# Patient Record
Sex: Female | Born: 1950 | Race: White | Hispanic: No | Marital: Married | Smoking: Never smoker
Health system: Southern US, Community
[De-identification: ages and names within clinical notes are randomized; demographics above are authoritative.]

## PROBLEM LIST (undated history)

## (undated) DIAGNOSIS — R739 Hyperglycemia, unspecified: Secondary | ICD-10-CM

## (undated) DIAGNOSIS — E0500 Thyrotoxicosis with diffuse goiter without thyrotoxic crisis or storm: Secondary | ICD-10-CM

## (undated) DIAGNOSIS — K219 Gastro-esophageal reflux disease without esophagitis: Secondary | ICD-10-CM

## (undated) DIAGNOSIS — E785 Hyperlipidemia, unspecified: Secondary | ICD-10-CM

## (undated) DIAGNOSIS — M1990 Unspecified osteoarthritis, unspecified site: Secondary | ICD-10-CM

## (undated) DIAGNOSIS — E669 Obesity, unspecified: Secondary | ICD-10-CM

## (undated) DIAGNOSIS — K5792 Diverticulitis of intestine, part unspecified, without perforation or abscess without bleeding: Secondary | ICD-10-CM

## (undated) DIAGNOSIS — K5900 Constipation, unspecified: Secondary | ICD-10-CM

## (undated) DIAGNOSIS — N200 Calculus of kidney: Secondary | ICD-10-CM

## (undated) DIAGNOSIS — I10 Essential (primary) hypertension: Secondary | ICD-10-CM

## (undated) DIAGNOSIS — N390 Urinary tract infection, site not specified: Secondary | ICD-10-CM

## (undated) HISTORY — DX: Obesity, unspecified: E66.9

## (undated) HISTORY — DX: Gastro-esophageal reflux disease without esophagitis: K21.9

## (undated) HISTORY — PX: WRIST SURGERY: SHX841

## (undated) HISTORY — DX: Thyrotoxicosis with diffuse goiter without thyrotoxic crisis or storm: E05.00

## (undated) HISTORY — DX: Hyperlipidemia, unspecified: E78.5

## (undated) HISTORY — DX: Unspecified osteoarthritis, unspecified site: M19.90

## (undated) HISTORY — DX: Hyperglycemia, unspecified: R73.9

## (undated) HISTORY — DX: Calculus of kidney: N20.0

## (undated) HISTORY — DX: Constipation, unspecified: K59.00

## (undated) HISTORY — PX: KNEE ARTHROSCOPY: SHX127

## (undated) HISTORY — DX: Urinary tract infection, site not specified: N39.0

---

## 1999-02-07 ENCOUNTER — Other Ambulatory Visit: Admission: RE | Admit: 1999-02-07 | Discharge: 1999-02-07 | Payer: Self-pay | Admitting: Obstetrics and Gynecology

## 2000-02-18 ENCOUNTER — Other Ambulatory Visit: Admission: RE | Admit: 2000-02-18 | Discharge: 2000-02-18 | Payer: Self-pay | Admitting: Obstetrics and Gynecology

## 2001-02-23 ENCOUNTER — Other Ambulatory Visit: Admission: RE | Admit: 2001-02-23 | Discharge: 2001-02-23 | Payer: Self-pay | Admitting: Obstetrics and Gynecology

## 2002-03-17 ENCOUNTER — Other Ambulatory Visit: Admission: RE | Admit: 2002-03-17 | Discharge: 2002-03-17 | Payer: Self-pay | Admitting: Obstetrics and Gynecology

## 2003-07-24 ENCOUNTER — Other Ambulatory Visit: Admission: RE | Admit: 2003-07-24 | Discharge: 2003-07-24 | Payer: Self-pay | Admitting: Family Medicine

## 2005-08-27 ENCOUNTER — Encounter: Admission: RE | Admit: 2005-08-27 | Discharge: 2005-08-27 | Payer: Self-pay | Admitting: Family Medicine

## 2005-09-16 ENCOUNTER — Encounter: Admission: RE | Admit: 2005-09-16 | Discharge: 2005-09-16 | Payer: Self-pay | Admitting: Family Medicine

## 2006-10-01 ENCOUNTER — Encounter: Admission: RE | Admit: 2006-10-01 | Discharge: 2006-10-01 | Payer: Self-pay | Admitting: Internal Medicine

## 2007-05-06 DIAGNOSIS — K5792 Diverticulitis of intestine, part unspecified, without perforation or abscess without bleeding: Secondary | ICD-10-CM

## 2007-05-06 HISTORY — DX: Diverticulitis of intestine, part unspecified, without perforation or abscess without bleeding: K57.92

## 2007-10-05 ENCOUNTER — Encounter: Admission: RE | Admit: 2007-10-05 | Discharge: 2007-10-05 | Payer: Self-pay | Admitting: Unknown Physician Specialty

## 2010-05-26 ENCOUNTER — Encounter: Payer: Self-pay | Admitting: Family Medicine

## 2010-05-27 ENCOUNTER — Encounter: Payer: Self-pay | Admitting: Unknown Physician Specialty

## 2011-09-24 ENCOUNTER — Encounter: Payer: Self-pay | Admitting: Gastroenterology

## 2011-09-24 HISTORY — PX: COLONOSCOPY: SHX5424

## 2012-10-18 ENCOUNTER — Other Ambulatory Visit: Payer: Self-pay | Admitting: Gastroenterology

## 2013-04-22 ENCOUNTER — Other Ambulatory Visit: Payer: Self-pay | Admitting: Orthopedic Surgery

## 2013-04-26 ENCOUNTER — Other Ambulatory Visit (HOSPITAL_COMMUNITY): Payer: Self-pay

## 2013-04-26 ENCOUNTER — Encounter (HOSPITAL_COMMUNITY)
Admission: RE | Admit: 2013-04-26 | Discharge: 2013-04-26 | Disposition: A | Discharge status: Home / Self Care | Payer: BC Managed Care – PPO | Source: Ambulatory Visit | Attending: Orthopedic Surgery | Admitting: Orthopedic Surgery

## 2013-04-26 ENCOUNTER — Encounter (HOSPITAL_COMMUNITY): Payer: Self-pay

## 2013-04-26 ENCOUNTER — Encounter (HOSPITAL_COMMUNITY): Payer: Self-pay | Admitting: Pharmacy Technician

## 2013-04-26 DIAGNOSIS — Z01812 Encounter for preprocedural laboratory examination: Secondary | ICD-10-CM | POA: Insufficient documentation

## 2013-04-26 DIAGNOSIS — Z01818 Encounter for other preprocedural examination: Secondary | ICD-10-CM | POA: Insufficient documentation

## 2013-04-26 HISTORY — DX: Diverticulitis of intestine, part unspecified, without perforation or abscess without bleeding: K57.92

## 2013-04-26 HISTORY — DX: Essential (primary) hypertension: I10

## 2013-04-26 HISTORY — DX: Unspecified osteoarthritis, unspecified site: M19.90

## 2013-04-26 LAB — CBC WITH DIFFERENTIAL/PLATELET
Basophils Absolute: 0 10*3/uL (ref 0.0–0.1)
Eosinophils Relative: 1 % (ref 0–5)
HCT: 42.8 % (ref 36.0–46.0)
Lymphocytes Relative: 23 % (ref 12–46)
MCHC: 35.3 g/dL (ref 30.0–36.0)
MCV: 92 fL (ref 78.0–100.0)
Neutro Abs: 7.6 10*3/uL (ref 1.7–7.7)
Platelets: 264 10*3/uL (ref 150–400)
RBC: 4.65 MIL/uL (ref 3.87–5.11)
RDW: 12.9 % (ref 11.5–15.5)
WBC: 10.9 10*3/uL — ABNORMAL HIGH (ref 4.0–10.5)

## 2013-04-26 LAB — URINE MICROSCOPIC-ADD ON

## 2013-04-26 LAB — SURGICAL PCR SCREEN: MRSA, PCR: NEGATIVE

## 2013-04-26 LAB — COMPREHENSIVE METABOLIC PANEL
ALT: 11 U/L (ref 0–35)
AST: 19 U/L (ref 0–37)
CO2: 27 mEq/L (ref 19–32)
Calcium: 9.2 mg/dL (ref 8.4–10.5)
Chloride: 101 mEq/L (ref 96–112)
GFR calc non Af Amer: >90 mL/min (ref >90)
Sodium: 138 mEq/L (ref 135–145)
Total Bilirubin: 0.3 mg/dL (ref 0.3–1.2)
Total Protein: 7.3 g/dL (ref 6.0–8.3)

## 2013-04-26 LAB — URINALYSIS, ROUTINE W REFLEX MICROSCOPIC
Bilirubin Urine: NEGATIVE
Glucose, UA: NEGATIVE mg/dL
Ketones, ur: NEGATIVE mg/dL
Specific Gravity, Urine: 1.007 (ref 1.005–1.030)
pH: 7 (ref 5.0–8.0)

## 2013-04-26 LAB — APTT: aPTT: 27 seconds (ref 24–37)

## 2013-04-26 LAB — TYPE AND SCREEN

## 2013-04-26 NOTE — Pre-Procedure Instructions (Addendum)
Amy Mcbride  04/26/2013   Your procedure is scheduled on:  Monday, January 5th. Report to [facility] ([facility] Main Entrance/ Entrance "A" at 9:15AM.  Call this number if you have problems the morning of surgery: [phone]   Remember:   Do not eat food or drink liquids after midnight.   Take these medicines the morning of surgery with A SIP OF WATER: - None.               Bring hormones to the hospital with you.  Stop taking all vitamins, Asprin, naproxen, herbal medications- Monday, December 29th.  Do not wear jewelry, make-up or nail polish.  Do not wear lotions, powders, or perfumes. You may wear deodorant.  Do not shave 48 hours prior to surgery.  Do not bring valuables to the hospital.  [facility] is not responsible for any belongings or valuables.               Contacts, dentures or bridgework may not be worn into surgery.  Leave suitcase in the car. After surgery it may be brought to your room.  For patients admitted to the hospital, discharge time is determined by your treatment team.                 Special Instructions: Shower using CHG 2 nights before surgery and the night before surgery.  If you shower the day of surgery use CHG.  Use special wash - you have one bottle of CHG for all showers.  You should use approximately 1/3 of the bottle for each shower.   Please read over the following fact sheets that you were given: Pain Booklet, Coughing and Deep Breathing, Blood Transfusion Information and Surgical Site Infection Prevention

## 2013-04-27 LAB — URINE CULTURE
Colony Count: NO GROWTH
Culture: NO GROWTH

## 2013-05-08 MED ORDER — CEFAZOLIN SODIUM-DEXTROSE 2-3 GM-% IV SOLR
2.0000 g | INTRAVENOUS | Status: AC
  Administered 2013-05-09: 2 g via INTRAVENOUS
  Filled 2013-05-08 (×2): qty 50

## 2013-05-08 MED ORDER — TRANEXAMIC ACID 100 MG/ML IV SOLN
1000.0000 mg | INTRAVENOUS | Status: AC
  Administered 2013-05-09: 1000 mg via INTRAVENOUS
  Filled 2013-05-08: qty 10

## 2013-05-08 MED ORDER — BUPIVACAINE LIPOSOME 1.3 % IJ SUSP
20.0000 mL | Freq: Once | INTRAMUSCULAR | Status: DC
  Filled 2013-05-08: qty 20

## 2013-05-09 ENCOUNTER — Inpatient Hospital Stay (HOSPITAL_COMMUNITY): Payer: BC Managed Care – PPO | Admitting: Anesthesiology

## 2013-05-09 ENCOUNTER — Inpatient Hospital Stay (HOSPITAL_COMMUNITY)
Admission: RE | Admit: 2013-05-09 | Discharge: 2013-05-10 | DRG: 470 | Disposition: A | Discharge status: Home Health | Payer: BC Managed Care – PPO | Source: Ambulatory Visit | Attending: Orthopedic Surgery | Admitting: Orthopedic Surgery

## 2013-05-09 ENCOUNTER — Encounter (HOSPITAL_COMMUNITY): Payer: BC Managed Care – PPO | Admitting: Anesthesiology

## 2013-05-09 ENCOUNTER — Encounter (HOSPITAL_COMMUNITY)
Admission: RE | Disposition: A | Discharge status: Home Health | Payer: BC Managed Care – PPO | Source: Ambulatory Visit | Attending: Orthopedic Surgery

## 2013-05-09 DIAGNOSIS — Z23 Encounter for immunization: Secondary | ICD-10-CM

## 2013-05-09 DIAGNOSIS — Z96652 Presence of left artificial knee joint: Secondary | ICD-10-CM

## 2013-05-09 DIAGNOSIS — M1710 Unilateral primary osteoarthritis, unspecified knee: Principal | ICD-10-CM | POA: Diagnosis present

## 2013-05-09 DIAGNOSIS — D62 Acute posthemorrhagic anemia: Secondary | ICD-10-CM | POA: Diagnosis not present

## 2013-05-09 DIAGNOSIS — I10 Essential (primary) hypertension: Secondary | ICD-10-CM | POA: Diagnosis present

## 2013-05-09 DIAGNOSIS — Z96659 Presence of unspecified artificial knee joint: Secondary | ICD-10-CM

## 2013-05-09 HISTORY — PX: TOTAL KNEE ARTHROPLASTY: SHX125

## 2013-05-09 LAB — CBC
HCT: 36.3 % (ref 36.0–46.0)
Hemoglobin: 12.5 g/dL (ref 12.0–15.0)
MCH: 31.7 pg (ref 26.0–34.0)
MCHC: 34.4 g/dL (ref 30.0–36.0)
MCV: 92.1 fL (ref 78.0–100.0)
PLATELETS: 247 10*3/uL (ref 150–400)
RBC: 3.94 MIL/uL (ref 3.87–5.11)
RDW: 12.5 % (ref 11.5–15.5)
WBC: 15.9 10*3/uL — ABNORMAL HIGH (ref 4.0–10.5)

## 2013-05-09 LAB — CREATININE, SERUM
Creatinine, Ser: 0.66 mg/dL (ref 0.50–1.10)
GFR calc Af Amer: >90 mL/min (ref >90)
GFR calc non Af Amer: >90 mL/min (ref >90)

## 2013-05-09 SURGERY — ARTHROPLASTY, KNEE, TOTAL
Anesthesia: Regional | Site: Knee | Laterality: Left

## 2013-05-09 MED ORDER — METOCLOPRAMIDE HCL 5 MG/ML IJ SOLN
INTRAMUSCULAR | Status: DC | PRN
Start: 2013-05-09 — End: 2013-05-09
  Administered 2013-05-09: 5 mg via INTRAVENOUS

## 2013-05-09 MED ORDER — LIDOCAINE HCL (CARDIAC) 20 MG/ML IV SOLN
INTRAVENOUS | Status: DC | PRN
Start: 2013-05-09 — End: 2013-05-09
  Administered 2013-05-09: 100 mg via INTRAVENOUS

## 2013-05-09 MED ORDER — METHOCARBAMOL 100 MG/ML IJ SOLN
500.0000 mg | Freq: Four times a day (QID) | INTRAVENOUS | Status: DC | PRN
  Filled 2013-05-09: qty 5

## 2013-05-09 MED ORDER — PNEUMOCOCCAL VAC POLYVALENT 25 MCG/0.5ML IJ INJ
0.5000 mL | INJECTION | INTRAMUSCULAR | Status: DC
  Filled 2013-05-09: qty 0.5

## 2013-05-09 MED ORDER — HYDROMORPHONE HCL PF 1 MG/ML IJ SOLN
INTRAMUSCULAR | Status: AC
  Administered 2013-05-09: 0.5 mg via INTRAVENOUS
  Filled 2013-05-09: qty 2

## 2013-05-09 MED ORDER — ONDANSETRON HCL 4 MG/2ML IJ SOLN
INTRAMUSCULAR | Status: DC | PRN
  Administered 2013-05-09: 4 mg via INTRAVENOUS

## 2013-05-09 MED ORDER — HYDROCHLOROTHIAZIDE 12.5 MG PO CAPS
12.5000 mg | ORAL_CAPSULE | Freq: Every day | ORAL | Status: DC
  Administered 2013-05-10: 12.5 mg via ORAL
  Filled 2013-05-09 (×2): qty 1

## 2013-05-09 MED ORDER — FENTANYL CITRATE 0.05 MG/ML IJ SOLN
INTRAMUSCULAR | Status: DC | PRN
  Administered 2013-05-09: 25 ug via INTRAVENOUS
  Administered 2013-05-09: 50 ug via INTRAVENOUS
  Administered 2013-05-09 (×2): 25 ug via INTRAVENOUS
  Administered 2013-05-09: 125 ug via INTRAVENOUS
  Administered 2013-05-09 (×3): 25 ug via INTRAVENOUS

## 2013-05-09 MED ORDER — SODIUM CHLORIDE 0.9 % IR SOLN
Status: DC | PRN
  Administered 2013-05-09 (×2): 1000 mL

## 2013-05-09 MED ORDER — ACETAMINOPHEN 325 MG PO TABS: 650.0000 mg | ORAL_TABLET | Freq: Four times a day (QID) | ORAL | Status: DC | PRN

## 2013-05-09 MED ORDER — PHENYLEPHRINE HCL 10 MG/ML IJ SOLN
INTRAMUSCULAR | Status: DC | PRN
  Administered 2013-05-09 (×3): 80 ug via INTRAVENOUS

## 2013-05-09 MED ORDER — SENNOSIDES-DOCUSATE SODIUM 8.6-50 MG PO TABS: 1.0000 | ORAL_TABLET | Freq: Every evening | ORAL | Status: DC | PRN

## 2013-05-09 MED ORDER — OXYCODONE HCL 5 MG PO TABS
5.0000 mg | ORAL_TABLET | ORAL | Status: DC | PRN
  Administered 2013-05-09: 10 mg via ORAL
  Administered 2013-05-09: 5 mg via ORAL
  Administered 2013-05-10 (×5): 10 mg via ORAL
  Filled 2013-05-09 (×6): qty 2

## 2013-05-09 MED ORDER — MENTHOL 3 MG MT LOZG: 1.0000 | LOZENGE | OROMUCOSAL | Status: DC | PRN

## 2013-05-09 MED ORDER — HYDROMORPHONE HCL PF 1 MG/ML IJ SOLN
INTRAMUSCULAR | Status: AC
  Administered 2013-05-09: 1 mg via INTRAVENOUS
  Filled 2013-05-09: qty 1

## 2013-05-09 MED ORDER — DIPHENHYDRAMINE HCL 12.5 MG/5ML PO ELIX: 12.5000 mg | ORAL_SOLUTION | ORAL | Status: DC | PRN

## 2013-05-09 MED ORDER — SODIUM CHLORIDE 0.9 % IV SOLN: INTRAVENOUS | Status: DC

## 2013-05-09 MED ORDER — CELECOXIB 200 MG PO CAPS
200.0000 mg | ORAL_CAPSULE | Freq: Two times a day (BID) | ORAL | Status: DC
  Administered 2013-05-09 – 2013-05-10 (×2): 200 mg via ORAL
  Filled 2013-05-09 (×3): qty 1

## 2013-05-09 MED ORDER — ONDANSETRON HCL 4 MG/2ML IJ SOLN: 4.0000 mg | Freq: Once | INTRAMUSCULAR | Status: DC | PRN

## 2013-05-09 MED ORDER — ACETAMINOPHEN 650 MG RE SUPP
650.0000 mg | Freq: Four times a day (QID) | RECTAL | Status: DC | PRN
Start: 2013-05-09 — End: 2013-05-10

## 2013-05-09 MED ORDER — HYDROMORPHONE HCL PF 1 MG/ML IJ SOLN
1.0000 mg | INTRAMUSCULAR | Status: DC | PRN
  Administered 2013-05-09: 1 mg via INTRAVENOUS

## 2013-05-09 MED ORDER — WHITE PETROLATUM GEL
Status: AC
  Administered 2013-05-09: 0.2
  Filled 2013-05-09: qty 5

## 2013-05-09 MED ORDER — METOCLOPRAMIDE HCL 5 MG PO TABS
5.0000 mg | ORAL_TABLET | Freq: Three times a day (TID) | ORAL | Status: DC | PRN
  Filled 2013-05-09: qty 2

## 2013-05-09 MED ORDER — 0.9 % SODIUM CHLORIDE (POUR BTL) OPTIME
TOPICAL | Status: DC | PRN
  Administered 2013-05-09: 1000 mL

## 2013-05-09 MED ORDER — CHLORHEXIDINE GLUCONATE 4 % EX LIQD: 60.0000 mL | Freq: Once | CUTANEOUS | Status: DC

## 2013-05-09 MED ORDER — LACTATED RINGERS IV SOLN
INTRAVENOUS | Status: DC
  Administered 2013-05-09: 50 mL/h via INTRAVENOUS

## 2013-05-09 MED ORDER — ALUM & MAG HYDROXIDE-SIMETH 200-200-20 MG/5ML PO SUSP: 30.0000 mL | ORAL | Status: DC | PRN

## 2013-05-09 MED ORDER — METOCLOPRAMIDE HCL 5 MG/ML IJ SOLN
5.0000 mg | Freq: Three times a day (TID) | INTRAMUSCULAR | Status: DC | PRN
  Administered 2013-05-09: 10 mg via INTRAVENOUS

## 2013-05-09 MED ORDER — CEFAZOLIN SODIUM-DEXTROSE 2-3 GM-% IV SOLR
2.0000 g | Freq: Four times a day (QID) | INTRAVENOUS | Status: AC
  Administered 2013-05-09 – 2013-05-10 (×2): 2 g via INTRAVENOUS
  Filled 2013-05-09 (×3): qty 50

## 2013-05-09 MED ORDER — FLEET ENEMA 7-19 GM/118ML RE ENEM: 1.0000 | ENEMA | Freq: Once | RECTAL | Status: AC | PRN

## 2013-05-09 MED ORDER — SIMVASTATIN 5 MG PO TABS
5.0000 mg | ORAL_TABLET | Freq: Every day | ORAL | Status: DC
  Administered 2013-05-09: 5 mg via ORAL
  Filled 2013-05-09 (×2): qty 1

## 2013-05-09 MED ORDER — BUPIVACAINE-EPINEPHRINE (PF) 0.5% -1:200000 IJ SOLN
INTRAMUSCULAR | Status: AC
  Filled 2013-05-09: qty 10

## 2013-05-09 MED ORDER — ENOXAPARIN SODIUM 30 MG/0.3ML ~~LOC~~ SOLN
30.0000 mg | Freq: Two times a day (BID) | SUBCUTANEOUS | Status: DC
  Administered 2013-05-10: 30 mg via SUBCUTANEOUS
  Filled 2013-05-09 (×3): qty 0.3

## 2013-05-09 MED ORDER — BISACODYL 5 MG PO TBEC: 5.0000 mg | DELAYED_RELEASE_TABLET | Freq: Every day | ORAL | Status: DC | PRN

## 2013-05-09 MED ORDER — LISINOPRIL-HYDROCHLOROTHIAZIDE 10-12.5 MG PO TABS: 1.0000 | ORAL_TABLET | Freq: Every day | ORAL | Status: DC

## 2013-05-09 MED ORDER — METHOCARBAMOL 500 MG PO TABS
ORAL_TABLET | ORAL | Status: AC
  Filled 2013-05-09: qty 1

## 2013-05-09 MED ORDER — OXYCODONE HCL 5 MG PO TABS
ORAL_TABLET | ORAL | Status: AC
  Filled 2013-05-09: qty 1

## 2013-05-09 MED ORDER — HYDROMORPHONE HCL PF 1 MG/ML IJ SOLN
0.2500 mg | INTRAMUSCULAR | Status: DC | PRN
  Administered 2013-05-09 (×4): 0.5 mg via INTRAVENOUS

## 2013-05-09 MED ORDER — PHENOL 1.4 % MT LIQD: 1.0000 | OROMUCOSAL | Status: DC | PRN

## 2013-05-09 MED ORDER — PROPOFOL 10 MG/ML IV BOLUS
INTRAVENOUS | Status: DC | PRN
  Administered 2013-05-09: 200 mg via INTRAVENOUS

## 2013-05-09 MED ORDER — DOCUSATE SODIUM 100 MG PO CAPS
100.0000 mg | ORAL_CAPSULE | Freq: Two times a day (BID) | ORAL | Status: DC
  Administered 2013-05-09 – 2013-05-10 (×2): 100 mg via ORAL
  Filled 2013-05-09 (×2): qty 1

## 2013-05-09 MED ORDER — ARTIFICIAL TEARS OP OINT
TOPICAL_OINTMENT | OPHTHALMIC | Status: DC | PRN
  Administered 2013-05-09: 1 via OPHTHALMIC

## 2013-05-09 MED ORDER — ONDANSETRON HCL 4 MG/2ML IJ SOLN: 4.0000 mg | Freq: Four times a day (QID) | INTRAMUSCULAR | Status: DC | PRN

## 2013-05-09 MED ORDER — LISINOPRIL 10 MG PO TABS
10.0000 mg | ORAL_TABLET | Freq: Every day | ORAL | Status: DC
  Administered 2013-05-09 – 2013-05-10 (×2): 10 mg via ORAL
  Filled 2013-05-09 (×2): qty 1

## 2013-05-09 MED ORDER — LACTATED RINGERS IV SOLN
INTRAVENOUS | Status: DC | PRN
  Administered 2013-05-09 (×2): via INTRAVENOUS

## 2013-05-09 MED ORDER — METHOCARBAMOL 500 MG PO TABS
500.0000 mg | ORAL_TABLET | Freq: Four times a day (QID) | ORAL | Status: DC | PRN
  Administered 2013-05-09 – 2013-05-10 (×4): 500 mg via ORAL
  Filled 2013-05-09 (×3): qty 1

## 2013-05-09 MED ORDER — METOCLOPRAMIDE HCL 5 MG/ML IJ SOLN
INTRAMUSCULAR | Status: AC
  Administered 2013-05-09: 17:00:00 10 mg via INTRAVENOUS
  Filled 2013-05-09: qty 2

## 2013-05-09 MED ORDER — OXYCODONE HCL ER 10 MG PO T12A
10.0000 mg | EXTENDED_RELEASE_TABLET | Freq: Two times a day (BID) | ORAL | Status: DC
  Administered 2013-05-09 – 2013-05-10 (×2): 10 mg via ORAL
  Filled 2013-05-09 (×2): qty 1

## 2013-05-09 MED ORDER — ONDANSETRON HCL 4 MG PO TABS: 4.0000 mg | ORAL_TABLET | Freq: Four times a day (QID) | ORAL | Status: DC | PRN

## 2013-05-09 MED ORDER — BUPIVACAINE LIPOSOME 1.3 % IJ SUSP
INTRAMUSCULAR | Status: DC | PRN
  Administered 2013-05-09: 20 mL

## 2013-05-09 MED ORDER — BUPIVACAINE-EPINEPHRINE 0.5% -1:200000 IJ SOLN
INTRAMUSCULAR | Status: DC | PRN
  Administered 2013-05-09: 30 mL

## 2013-05-09 MED ORDER — FENTANYL CITRATE 0.05 MG/ML IJ SOLN
INTRAMUSCULAR | Status: AC
  Administered 2013-05-09: 100 ug
  Filled 2013-05-09: qty 2

## 2013-05-09 MED ORDER — ZOLPIDEM TARTRATE 5 MG PO TABS: 5.0000 mg | ORAL_TABLET | Freq: Every evening | ORAL | Status: DC | PRN

## 2013-05-09 SURGICAL SUPPLY — 57 items
BANDAGE ESMARK 6X9 LF (GAUZE/BANDAGES/DRESSINGS) ×1 IMPLANT
BLADE SAGITTAL 13X1.27X60 (BLADE) ×2 IMPLANT
BLADE SAGITTAL 13X1.27X60MM (BLADE) ×1
BLADE SAW SGTL 83.5X18.5 (BLADE) ×3 IMPLANT
BNDG ESMARK 6X9 LF (GAUZE/BANDAGES/DRESSINGS) ×3
BOWL SMART MIX CTS (DISPOSABLE) ×3 IMPLANT
CAP POR NKTM CP VIT E LN CER ×3 IMPLANT
CEMENT BONE SIMPLEX SPEEDSET (Cement) ×6 IMPLANT
COVER SURGICAL LIGHT HANDLE (MISCELLANEOUS) ×3 IMPLANT
CUFF TOURNIQUET SINGLE 34IN LL (TOURNIQUET CUFF) ×3 IMPLANT
DRAPE EXTREMITY T 121X128X90 (DRAPE) ×3 IMPLANT
DRAPE INCISE IOBAN 66X45 STRL (DRAPES) ×6 IMPLANT
DRAPE PROXIMA HALF (DRAPES) ×3 IMPLANT
DRAPE U-SHAPE 47X51 STRL (DRAPES) ×3 IMPLANT
DRSG ADAPTIC 3X8 NADH LF (GAUZE/BANDAGES/DRESSINGS) ×3 IMPLANT
DRSG PAD ABDOMINAL 8X10 ST (GAUZE/BANDAGES/DRESSINGS) ×3 IMPLANT
DURAPREP 26ML APPLICATOR (WOUND CARE) ×3 IMPLANT
ELECT REM PT RETURN 9FT ADLT (ELECTROSURGICAL) ×3
ELECTRODE REM PT RTRN 9FT ADLT (ELECTROSURGICAL) ×1 IMPLANT
EVACUATOR 1/8 PVC DRAIN (DRAIN) ×3 IMPLANT
GLOVE BIO SURGEON STRL SZ 6.5 (GLOVE) ×8 IMPLANT
GLOVE BIO SURGEONS STRL SZ 6.5 (GLOVE) ×4
GLOVE BIOGEL M 7.0 STRL (GLOVE) ×3 IMPLANT
GLOVE BIOGEL PI IND STRL 6.5 (GLOVE) ×2 IMPLANT
GLOVE BIOGEL PI IND STRL 7.5 (GLOVE) ×1 IMPLANT
GLOVE BIOGEL PI IND STRL 8.5 (GLOVE) ×1 IMPLANT
GLOVE BIOGEL PI INDICATOR 6.5 (GLOVE) ×4
GLOVE BIOGEL PI INDICATOR 7.5 (GLOVE) ×2
GLOVE BIOGEL PI INDICATOR 8.5 (GLOVE) ×2
GLOVE SURG ORTHO 8.0 STRL STRW (GLOVE) ×6 IMPLANT
GOWN PREVENTION PLUS XLARGE (GOWN DISPOSABLE) ×6 IMPLANT
GOWN STRL NON-REIN LRG LVL3 (GOWN DISPOSABLE) ×6 IMPLANT
HANDPIECE INTERPULSE COAX TIP (DISPOSABLE) ×2
HOOD PEEL AWAY FACE SHEILD DIS (HOOD) ×12 IMPLANT
KIT BASIN OR (CUSTOM PROCEDURE TRAY) ×3 IMPLANT
KIT ROOM TURNOVER OR (KITS) ×3 IMPLANT
MANIFOLD NEPTUNE II (INSTRUMENTS) ×3 IMPLANT
NEEDLE 22X1 1/2 (OR ONLY) (NEEDLE) ×3 IMPLANT
NS IRRIG 1000ML POUR BTL (IV SOLUTION) ×3 IMPLANT
PACK TOTAL JOINT (CUSTOM PROCEDURE TRAY) ×3 IMPLANT
PAD ARMBOARD 7.5X6 YLW CONV (MISCELLANEOUS) ×3 IMPLANT
PADDING CAST COTTON 6X4 STRL (CAST SUPPLIES) ×3 IMPLANT
SET HNDPC FAN SPRY TIP SCT (DISPOSABLE) ×1 IMPLANT
SPONGE GAUZE 4X4 12PLY (GAUZE/BANDAGES/DRESSINGS) ×3 IMPLANT
STAPLER VISISTAT 35W (STAPLE) ×3 IMPLANT
SUCTION FRAZIER TIP 10 FR DISP (SUCTIONS) ×3 IMPLANT
SUT BONE WAX W31G (SUTURE) ×3 IMPLANT
SUT VIC AB 0 CTB1 27 (SUTURE) ×6 IMPLANT
SUT VIC AB 1 CT1 27 (SUTURE) ×6
SUT VIC AB 1 CT1 27XBRD ANBCTR (SUTURE) ×3 IMPLANT
SUT VIC AB 2-0 CT1 27 (SUTURE) ×4
SUT VIC AB 2-0 CT1 TAPERPNT 27 (SUTURE) ×2 IMPLANT
SYR CONTROL 10ML LL (SYRINGE) ×3 IMPLANT
TOWEL OR 17X24 6PK STRL BLUE (TOWEL DISPOSABLE) ×3 IMPLANT
TOWEL OR 17X26 10 PK STRL BLUE (TOWEL DISPOSABLE) ×3 IMPLANT
TRAY FOLEY CATH 14FR (SET/KITS/TRAYS/PACK) IMPLANT
WATER STERILE IRR 1000ML POUR (IV SOLUTION) ×3 IMPLANT

## 2013-05-09 NOTE — Anesthesia Preprocedure Evaluation (Addendum)
Anesthesia Evaluation  Patient identified by MRN, date of birth, ID band Patient awake    Reviewed: Allergy & Precautions, H&P , NPO status , Patient's Chart, lab work & pertinent test results  Airway Mallampati: II      Dental  (+) Teeth Intact and Dental Advidsory Given   Pulmonary          Cardiovascular hypertension, On Medications     Neuro/Psych    GI/Hepatic   Endo/Other    Renal/GU      Musculoskeletal   Abdominal   Peds  Hematology   Anesthesia Other Findings   Reproductive/Obstetrics                          Anesthesia Physical Anesthesia Plan  ASA: II  Anesthesia Plan:    Post-op Pain Management:    Induction:   Airway Management Planned:   Additional Equipment:   Intra-op Plan:   Post-operative Plan:   Informed Consent:   Dental Advisory Given  Plan Discussed with: Anesthesiologist, CRNA and Surgeon  Anesthesia Plan Comments:        Anesthesia Quick Evaluation

## 2013-05-09 NOTE — Preoperative (Signed)
Beta Blockers   Reason not to administer Beta Blockers:Not Applicable 

## 2013-05-09 NOTE — Progress Notes (Signed)
Orthopedic Tech Progress Note Patient Details:  Amy Mcbride 1950-05-25 [id number]  Patient ID: [name], female   DOB: February 23, 1951, 62 y.o.   MRN: [id number]   [name] 05/09/2013, 2:38 PMTrapeze bar and foot roll

## 2013-05-09 NOTE — Anesthesia Procedure Notes (Signed)
Procedure Name: LMA Insertion Date/Time: 05/09/2013 11:48 AM Performed by: Neldon Newport Pre-anesthesia Checklist: Patient identified, Timeout performed, Suction available, Emergency Drugs available and Patient being monitored Patient Re-evaluated:Patient Re-evaluated prior to inductionOxygen Delivery Method: Circle system utilized Preoxygenation: Pre-oxygenation with 100% oxygen Intubation Type: IV induction Ventilation: Mask ventilation without difficulty LMA: LMA inserted LMA Size: 4.0 Tube type: Oral Number of attempts: 1 Placement Confirmation: positive ETCO2 and breath sounds checked- equal and bilateral Tube secured with: Tape Dental Injury: Teeth and Oropharynx as per pre-operative assessment

## 2013-05-09 NOTE — Progress Notes (Signed)
Orthopedic Tech Progress Note Patient Details:  Amy Mcbride 10-12-1950 [id number]  CPM Left Knee CPM Left Knee: On Left Knee Flexion (Degrees): 90 Left Knee Extension (Degrees): 0 Additional Comments: Trapeze bar and foot roll   [name] 05/09/2013, 2:38 PM

## 2013-05-09 NOTE — H&P (Signed)
  Amy Mcbride MRN:  [id number] DOB/SEX:  [birth year]/female  CHIEF COMPLAINT:  Painful left Knee  HISTORY: Patient is a 63 y.o. female presented with a history of pain in the left knee. Onset of symptoms was gradual starting several years ago with gradually worsening course since that time. Prior procedures on the knee include none. Patient has been treated conservatively with over-the-counter NSAIDs and activity modification. Patient currently rates pain in the knee at 10 out of 10 with activity. There is pain at night.  PAST MEDICAL HISTORY: There are no active problems to display for this patient.  Past Medical History  Diagnosis Date  . Hypertension   . Arthritis   . Diverticulitis 05/2007    hospitalized   Past Surgical History  Procedure Laterality Date  . Cesarean section      x2  . Knee arthroscopy Left     torn menisicus  . Wrist surgery Left     gangilon cyst     MEDICATIONS:   No prescriptions prior to admission    ALLERGIES:  No Known Allergies  REVIEW OF SYSTEMS:  Pertinent items are noted in HPI.   FAMILY HISTORY:  No family history on file.  SOCIAL HISTORY:   History  Substance Use Topics  . Smoking status: Never Smoker   . Smokeless tobacco: Not on file  . Alcohol Use: 1.2 oz/week    2 Glasses of wine per week     EXAMINATION:  Vital signs in last 24 hours:    General appearance: alert, cooperative and no distress Lungs: clear to auscultation bilaterally Heart: regular rate and rhythm, S1, S2 normal, no murmur, click, rub or gallop Abdomen: soft, non-tender; bowel sounds normal; no masses,  no organomegaly Extremities: extremities normal, atraumatic, no cyanosis or edema and Homans sign is negative, no sign of DVT Pulses: 2+ and symmetric Skin: Skin color, texture, turgor normal. No rashes or lesions Neurologic: Alert and oriented X 3, normal strength and tone. Normal symmetric reflexes. Normal coordination and gait  Musculoskeletal:  ROM  0-110, Ligaments intact,  Imaging Review Plain radiographs demonstrate severe degenerative joint disease of the left knee. The overall alignment is mild valgus. The bone quality appears to be good for age and reported activity level.  Assessment/Plan: End stage arthritis, left knee   The patient history, physical examination and imaging studies are consistent with advanced degenerative joint disease of the left knee. The patient has failed conservative treatment.  The clearance notes were reviewed.  After discussion with the patient it was felt that Total Knee Replacement was indicated. The procedure,  risks, and benefits of total knee arthroplasty were presented and reviewed. The risks including but not limited to aseptic loosening, infection, blood clots, vascular injury, stiffness, patella tracking problems complications among others were discussed. The patient acknowledged the explanation, agreed to proceed with the plan.  Amy Mcbride 05/09/2013, 7:07 AM

## 2013-05-09 NOTE — Anesthesia Postprocedure Evaluation (Signed)
  Anesthesia Post-op Note  Patient: Amy Mcbride  Procedure(s) Performed: Procedure(s): TOTAL KNEE ARTHROPLASTY (Left)  Patient Location: PACU  Anesthesia Type:General and block  Level of Consciousness: awake and alert   Airway and Oxygen Therapy: Patient Spontanous Breathing  Post-op Pain: mild  Post-op Assessment: Post-op Vital signs reviewed, Patient's Cardiovascular Status Stable and Respiratory Function Stable  Post-op Vital Signs: Reviewed  Filed Vitals:   05/09/13 1645  BP: 118/56  Pulse: 88  Temp:   Resp: 15    Complications: No apparent anesthesia complications

## 2013-05-09 NOTE — Progress Notes (Signed)
Orthopedic Tech Progress Note Patient Details:  Amy Mcbride [birth year] [id number] Put on cpm at 8:20 pm LLE 0-60 Ortho Devices Ortho Device/Splint Location: footsie roll   Braulio Bosch 05/09/2013, 8:24 PM

## 2013-05-09 NOTE — Transfer of Care (Signed)
Immediate Anesthesia Transfer of Care Note  Patient: Amy Mcbride  Procedure(s) Performed: Procedure(s): TOTAL KNEE ARTHROPLASTY (Left)  Patient Location: PACU  Anesthesia Type:General  Level of Consciousness: awake, alert  and oriented  Airway & Oxygen Therapy: Patient Spontanous Breathing and Patient connected to nasal cannula oxygen  Post-op Assessment: Report given to PACU RN, Post -op Vital signs reviewed and stable and Patient moving all extremities X 4  Post vital signs: Reviewed and stable  Complications: No apparent anesthesia complications

## 2013-05-10 LAB — CBC
HCT: 35.4 % — ABNORMAL LOW (ref 36.0–46.0)
Hemoglobin: 11.9 g/dL — ABNORMAL LOW (ref 12.0–15.0)
MCH: 31.3 pg (ref 26.0–34.0)
MCHC: 33.6 g/dL (ref 30.0–36.0)
MCV: 93.2 fL (ref 78.0–100.0)
Platelets: 234 10*3/uL (ref 150–400)
RBC: 3.8 MIL/uL — ABNORMAL LOW (ref 3.87–5.11)
RDW: 12.9 % (ref 11.5–15.5)
WBC: 12 10*3/uL — AB (ref 4.0–10.5)

## 2013-05-10 LAB — BASIC METABOLIC PANEL
BUN: 10 mg/dL (ref 6–23)
CHLORIDE: 99 meq/L (ref 96–112)
CO2: 27 mEq/L (ref 19–32)
Calcium: 8.3 mg/dL — ABNORMAL LOW (ref 8.4–10.5)
Creatinine, Ser: 0.61 mg/dL (ref 0.50–1.10)
GFR calc non Af Amer: >90 mL/min (ref >90)
Glucose, Bld: 95 mg/dL (ref 70–99)
POTASSIUM: 3.8 meq/L (ref 3.7–5.3)
Sodium: 137 mEq/L (ref 137–147)

## 2013-05-10 MED ORDER — OXYCODONE HCL 5 MG PO TABS: 5.0000 mg | ORAL_TABLET | ORAL | Status: DC | PRN

## 2013-05-10 MED ORDER — ENOXAPARIN SODIUM 40 MG/0.4ML ~~LOC~~ SOLN: 40.0000 mg | SUBCUTANEOUS | Status: DC

## 2013-05-10 MED ORDER — METHOCARBAMOL 500 MG PO TABS: 500.0000 mg | ORAL_TABLET | Freq: Four times a day (QID) | ORAL | Status: DC | PRN

## 2013-05-10 NOTE — Op Note (Signed)
TOTAL KNEE REPLACEMENT OPERATIVE NOTE:  05/09/2013  7:53 AM  PATIENT:  Amy Mcbride  63 y.o. female  PRE-OPERATIVE DIAGNOSIS:  osteoarthritis left knee  POST-OPERATIVE DIAGNOSIS:  osteoarthritis left knee  PROCEDURE:  Procedure(s): TOTAL KNEE ARTHROPLASTY  SURGEON:  Surgeon(s): Vickey Huger, MD  PHYSICIAN ASSISTANT: Carlynn Spry, [facility]  ANESTHESIA:   general  DRAINS: Hemovac  SPECIMEN: None  COUNTS:  Correct  TOURNIQUET:   Total Tourniquet Time Documented: Thigh (Left) - 61 minutes Total: Thigh (Left) - 61 minutes   DICTATION:  Indication for procedure:    The patient is a 63 y.o. female who has failed conservative treatment for osteoarthritis left knee.  Informed consent was obtained prior to anesthesia. The risks versus benefits of the operation were explain and in a way the patient can, and did, understand.   On the implant demand matching protocol, this patient scored 15.  Therefore, this patient was receive a polyethylene insert with vitamin E which is a high demand implant.  Description of procedure:     The patient was taken to the operating room and placed under anesthesia.  The patient was positioned in the usual fashion taking care that all body parts were adequately padded and/or protected.  I foley catheter was not placed.  A tourniquet was applied and the leg prepped and draped in the usual sterile fashion.  The extremity was exsanguinated with the esmarch and tourniquet inflated to 350 mmHg.  Pre-operative range of motion was normal.  The knee was in 5 degree of mild varus.  A midline incision approximately 6-7 inches long was made with a #10 blade.  A new blade was used to make a parapatellar arthrotomy going 2-3 cm into the quadriceps tendon, over the patella, and alongside the medial aspect of the patellar tendon.  A synovectomy was then performed with the #10 blade and forceps. I then elevated the deep MCL off the medial tibial metaphysis subperiosteally  around to the semimembranosus attachment.    I everted the patella and used calipers to measure patellar thickness.  I used the reamer to ream down to appropriate thickness to recreate the native thickness.  I then removed excess bone with the rongeur and sagittal saw.  I used the appropriately sized template and drilled the three lug holes.  I then put the trial in place and measured the thickness with the calipers to ensure recreation of the native thickness.  The trial was then removed and the patella subluxed and the knee brought into flexion.  A homan retractor was place to retract and protect the patella and lateral structures.  A Z-retractor was place medially to protect the medial structures.  The extra-medullary alignment system was used to make cut the tibial articular surface perpendicular to the anamotic axis of the tibia and in 3 degrees of posterior slope.  The cut surface and alignment jig was removed.  I then used the intramedullary alignment guide to make a 3 valgus cut on the distal femur.  I then marked out the epicondylar axis on the distal femur.  The posterior condylar axis measured 3 degrees.  I then used the anterior referencing sizer and measured the femur to be a size 7.  The 4-In-1 cutting block was screwed into place in external rotation matching the posterior condylar angle, making our cuts perpendicular to the epicondylar axis.  Anterior, posterior and chamfer cuts were made with the sagittal saw.  The cutting block and cut pieces were removed.  A lamina  spreader was placed in 90 degrees of flexion.  The ACL, PCL, menisci, and posterior condylar osteophytes were removed.  A 11 mm spacer blocked was found to offer good flexion and extension gap balance after mild in degree releasing.   The scoop retractor was then placed and the femoral finishing block was pinned in place.  The small sagittal saw was used as well as the lug drill to finish the femur.  The block and cut surfaces  were removed and the medullary canal hole filled with autograft bone from the cut pieces.  The tibia was delivered forward in deep flexion and external rotation.  A size D tray was selected and pinned into place centered on the medial 1/3 of the tibial tubercle.  The reamer and keel was used to prepare the tibia through the tray.    I then trialed with the size 7 femur, size E tibia, a 11 mm insert and the 32 patella.  I had excellent flexion/extension gap balance, excellent patella tracking.  Flexion was full and beyond 120 degrees; extension was zero.  These components were chosen and the staff opened them to me on the back table while the knee was lavaged copiously and the cement mixed.  The soft tissue was infiltrated with 60cc of exparel 1.3% through a 21 gauge needle.  I cemented in the components and removed all excess cement.  The polyethylene tibial component was snapped into place and the knee placed in extension while cement was hardening.  The capsule was infilltrated with 30cc of .25% Marcaine with epinephrine.  A hemovac was place in the joint exiting superolaterally.  A pain pump was place superomedially superficial to the arthrotomy.  Once the cement was hard, the tourniquet was let down.  Hemostasis was obtained.  The arthrotomy was closed with figure-8 #1 vicryl sutures.  The deep soft tissues were closed with #0 vicryls and the subcuticular layer closed with a running #2-0 vicryl.  The skin was reapproximated and closed with skin staples.  The wound was dressed with xeroform, 4 x4's, 2 ABD sponges, a single layer of webril and a TED stocking.   The patient was then awakened, extubated, and taken to the recovery room in stable condition.  BLOOD LOSS:  300cc DRAINS: 1 hemovac, 1 pain catheter COMPLICATIONS:  None.  PLAN OF CARE: Admit to inpatient   PATIENT DISPOSITION:  PACU - hemodynamically stable.   Delay start of Pharmacological VTE agent (>24hrs) due to surgical blood loss or  risk of bleeding:  not applicable  Please fax a copy of this op note to my office at [phone] (please only include page 1 and 2 of the Case Information op note)

## 2013-05-10 NOTE — Evaluation (Signed)
Physical Therapy Evaluation Patient Details Name: Amy Mcbride MRN: [id number] DOB: [birth year] Today's Date: 05/10/2013 Time: 1100-1136 PT Time Calculation (min): 36 min  PT Assessment / Plan / Recommendation History of Present Illness  Patient is a 63 y.o. female presented with a history of pain in the left knee.  Pt underwent LTKA via Dr. Ronnie Derby.  Clinical Impression  This patient presents with acute pain and decreased functional independence following the above mentioned procedure. At the time of PT eval, pt required min guard for functional mobility and ambulation, however bed mobility was not assessed. This patient is appropriate for skilled PT interventions to address functional limitations, improve safety and independence with functional mobility, and return to PLOF.    PT Assessment  Patient needs continued PT services    Follow Up Recommendations  Home health PT    Does the patient have the potential to tolerate intense rehabilitation      Barriers to Discharge        Equipment Recommendations  Rolling walker with 5" wheels;3in1 (PT)    Recommendations for Other Services     Frequency 7X/week    Precautions / Restrictions Precautions Precautions: Fall;Knee Precaution Comments: Discussed towel roll under heel but no pillow under knee Restrictions Weight Bearing Restrictions: Yes LLE Weight Bearing: Weight bearing as tolerated   Pertinent Vitals/Pain Pt reports 4/10 pain at rest      Mobility  Bed Mobility Bed Mobility: Not assessed Details for Bed Mobility Assistance: Pt received up in chair. Transfers Transfers: Sit to Stand;Stand to Sit Sit to Stand: 4: Min guard;From chair/3-in-1;With upper extremity assist Stand to Sit: 4: Min guard;To chair/3-in-1;With upper extremity assist Details for Transfer Assistance: VC's for hand placement and safety awareness with the RW.  Ambulation/Gait Ambulation/Gait Assistance: 4: Min guard Ambulation Distance (Feet): 45  Feet Assistive device: Rolling walker Ambulation/Gait Assistance Details: VC's for sequencing with the RW. Cues for increased heel strike, increased swing through on RLE, and increased quad activation.  Gait Pattern: Step-to pattern;Decreased stride length;Trunk flexed Gait velocity: Decreased General Gait Details: May need RW lowered Stairs: No Wheelchair Mobility Wheelchair Mobility: No    Exercises Total Joint Exercises Ankle Circles/Pumps: 10 reps Quad Sets: 10 reps Towel Squeeze: 10 reps Short Arc Quad: 10 reps Heel Slides: 10 reps Hip ABduction/ADduction: 10 reps Goniometric ROM: 8-75 AAROM   PT Diagnosis: Difficulty walking;Acute pain  PT Problem List: Decreased strength;Decreased range of motion;Decreased activity tolerance;Decreased balance;Decreased mobility;Decreased knowledge of use of DME;Decreased safety awareness;Pain PT Treatment Interventions: DME instruction;Gait training;Stair training;Functional mobility training;Therapeutic activities;Therapeutic exercise;Patient/family education;Neuromuscular re-education     PT Goals(Current goals can be found in the care plan section) Acute Rehab PT Goals Patient Stated Goal: To return home PT Goal Formulation: With patient/family Time For Goal Achievement: 05/24/13 Potential to Achieve Goals: Good  Visit Information  Last PT Received On: 05/10/13 Assistance Needed: +1 History of Present Illness: Patient is a 63 y.o. female presented with a history of pain in the left knee.  Pt underwent LTKA via Dr. Ronnie Derby.       Prior [place] expects to be discharged to:: Private residence Living Arrangements: Spouse/significant other Available Help at Discharge: Available 24 hours/day;Family Type of Home: House Home Access: Stairs to enter [employer] of Steps: 1 step to a landing, then 1 step to the kitchen door Entrance Stairs-Rails: None Home Layout: Multi-level;Able to live on  main level with bedroom/bathroom Home Equipment: None Prior Function Level of Independence: Independent Communication Communication:  No difficulties Dominant Hand: Right    Cognition  Cognition Arousal/Alertness: Awake/alert Behavior During Therapy: WFL for tasks assessed/performed Overall Cognitive Status: Within Functional Limits for tasks assessed    Extremity/Trunk Assessment Upper Extremity Assessment Upper Extremity Assessment: Overall WFL for tasks assessed Lower Extremity Assessment Lower Extremity Assessment: Overall WFL for tasks assessed;LLE deficits/detail LLE Deficits / Details: Decreased strength and AROM consistent with TKA LLE: Unable to fully assess due to pain Cervical / Trunk Assessment Cervical / Trunk Assessment: Normal   Balance Balance Balance Assessed: Yes Static Standing Balance Static Standing - Balance Support: Right upper extremity supported;Left upper extremity supported Static Standing - Level of Assistance: 5: Stand by assistance  End of Session PT - End of Session Equipment Utilized During Treatment: Gait belt Activity Tolerance: Patient tolerated treatment well Patient left: in chair;with call bell/phone within reach;with family/visitor present Nurse Communication: Mobility status CPM Left Knee CPM Left Knee: Off  GP     Jolyn Lent 05/10/2013, 1:45 PM  Jolyn Lent, PT, DPT [phone]

## 2013-05-10 NOTE — Care Management Note (Signed)
CARE MANAGEMENT NOTE 05/10/2013  Patient:  Amy Mcbride, Amy Mcbride   Account Number:  [id number]  Date Initiated:  05/10/2013  Documentation initiated by:  Ricki Miller  Subjective/Objective Assessment:   63 yr old female s/p Left total knee arthroplasty     Action/Plan:   CM  spoke with patient and husband concerning home health and DME needs. Patient preoperatively setup with [facility], no changes. DME to be delivered by [employer].Patient has family support at discharge.   Anticipated DC Date:  05/10/2013   Anticipated DC Plan:  [place]  CM consult      PAC Choice  [place]   Choice offered to / List presented to:  C-1 Patient   DME arranged  3-N-1  [place]  CPM      DME agency  [employer]     [place] arranged  HH-2 PT      [place] agency  [facility]   Status of service:  Completed, signed off Discharge Disposition:  [place]  Per UR Regulation:

## 2013-05-10 NOTE — Progress Notes (Signed)
Physical Therapy Treatment Patient Details Name: Amy Mcbride MRN: [id number] DOB: [birth year] Today's Date: 05/10/2013 Time: 3016-0109 PT Time Calculation (min): 28 min  PT Assessment / Plan / Recommendation  History of Present Illness Patient is a 63 y.o. female presented with a history of pain in the left knee.  Pt underwent LTKA via Dr. Ronnie Derby.   PT Comments   Patient seen this afternoon for stair training. Able to complete however, continues to have slight buckling of L LE. Patient eager to go home this afternoon and does not want to stay another night for more therapy. Husband present for session and will be with patient at all time. He was educated on guarding and safety with RW.   Follow Up Recommendations  Home health PT     Does the patient have the potential to tolerate intense rehabilitation     Barriers to Discharge        Equipment Recommendations  Rolling walker with 5" wheels;3in1 (PT)    Recommendations for Other Services    Frequency 7X/week   Progress towards PT Goals Progress towards PT goals: Progressing toward goals  Plan Current plan remains appropriate    Precautions / Restrictions Precautions Precautions: Fall;Knee Precaution Comments: Discussed towel roll under heel but no pillow under knee Restrictions Weight Bearing Restrictions: Yes LLE Weight Bearing: Weight bearing as tolerated   Pertinent Vitals/Pain no apparent distress    Mobility  Bed Mobility Bed Mobility: Supine to Sit;Sit to Supine Supine to Sit: 4: Min assist Sit to Supine: 4: Min assist Details for Bed Mobility Assistance: A with L LE in and out of bed Transfers Transfers: Sit to Stand;Stand to Sit Sit to Stand: 4: Min guard;With upper extremity assist;From bed;From chair/3-in-1 Stand to Sit: 4: Min guard;With upper extremity assist;To bed;To chair/3-in-1 Details for Transfer Assistance: VC's for hand placement  Ambulation/Gait Ambulation/Gait Assistance: 4: Min guard Ambulation  Distance (Feet): 60 Feet Assistive device: Rolling walker Ambulation/Gait Assistance Details: Cues for gait sequence and RW positioning Gait Pattern: Step-to pattern;Decreased stride length Gait velocity: Decreased General Gait Details: May need RW lowered Stairs: Yes Stairs Assistance: 4: Min assist Stairs Assistance Details (indicate cue type and reason): Patient practiced one step twice. Cues for technique and sequency Stair Management Technique: Backwards;Step to pattern;No rails Number of Stairs: 1 Wheelchair Mobility Wheelchair Mobility: No    Exercises Total Joint Exercises Ankle Circles/Pumps: 10 reps Quad Sets: 10 reps;AROM Towel Squeeze: 10 reps Short Arc Quad: 10 reps;AAROM Heel Slides: 10 reps;AAROM Hip ABduction/ADduction: 10 reps;AROM Straight Leg Raises: AAROM;10 reps Goniometric ROM: 8-75 AAROM   PT Diagnosis: Difficulty walking;Acute pain  PT Problem List: Decreased strength;Decreased range of motion;Decreased activity tolerance;Decreased balance;Decreased mobility;Decreased knowledge of use of DME;Decreased safety awareness;Pain PT Treatment Interventions: DME instruction;Gait training;Stair training;Functional mobility training;Therapeutic activities;Therapeutic exercise;Patient/family education;Neuromuscular re-education   PT Goals (current goals can now be found in the care plan section) Acute Rehab PT Goals Patient Stated Goal: To return home PT Goal Formulation: With patient/family Time For Goal Achievement: 05/24/13 Potential to Achieve Goals: Good  Visit Information  Last PT Received On: 05/10/13 Assistance Needed: +1 History of Present Illness: Patient is a 63 y.o. female presented with a history of pain in the left knee.  Pt underwent LTKA via Dr. Ronnie Derby.    Subjective Data  Patient Stated Goal: To return home   Cognition  Cognition Arousal/Alertness: Awake/alert Behavior During Therapy: WFL for tasks assessed/performed Overall Cognitive  Status: Within Functional Limits for tasks assessed  Balance     End of Session PT - End of Session Equipment Utilized During Treatment: Gait belt Activity Tolerance: Patient tolerated treatment well Patient left: with call bell/phone within reach;with family/visitor present;in bed Nurse Communication: Mobility status CPM Left Knee CPM Left Knee: Off   GP     Jacqualyn Posey 05/10/2013, 2:17 PM  05/10/2013 Jacqualyn Posey PTA [phone] pager [phone] office

## 2013-05-10 NOTE — Progress Notes (Signed)
Utilization review completed.  

## 2013-05-10 NOTE — Discharge Instructions (Signed)
Diet: As you were doing prior to hospitalization   Activity:  Increase activity slowly as tolerated                  No lifting or driving for 6 weeks  Shower:  May shower without a dressing once there is no drainage from your wound.                 Do NOT wash over the wound.                 Dressing:  You may change your dressing on Wednesday                    Then change the dressing daily with sterile 4"x4"s gauze dressing                     And TED hose for knees.  Weight Bearing:  Weight bearing as tolerated as taught in physical therapy.  Use a                                walker or Crutches as instructed.  To prevent constipation: you may use a stool softener such as -               Colace ( over the counter) 100 mg by mouth twice a day                Drink plenty of fluids ( prune juice may be helpful) and high fiber foods                Miralax ( over the counter) for constipation as needed.    Precautions:  If you experience chest pain or shortness of breath - call 911 immediately               For transfer to the hospital emergency department!!               If you develop a fever greater that 101 F, purulent drainage from wound,                             increased redness or drainage from wound, or calf pain -- Call the office.  Follow- Up Appointment:  Please call for an appointment to be seen on 05/24/13                                              [facility] office:  [phone]            [place], [place] [address]                  Home Health PT to be provided by [facility] [phone]

## 2013-05-10 NOTE — Evaluation (Signed)
Occupational Therapy Evaluation Patient Details Name: Amy Mcbride MRN: [id number] DOB: December 10, 1950 Today's Date: 05/10/2013 Time: 4097-3532 OT Time Calculation (min): 38 min  OT Assessment / Plan / Recommendation History of present illness Patient is a 63 y.o. female presented with a history of pain in the left knee.  Pt underwent LTKA via Dr. Ronnie Derby.   Clinical Impression   Pt currently overall min guard assist to occasional min assist for LB dressing and functional transfers.  She will have initial 24 hour supervision from her spouse at discharge so do not anticipate any further OT needs at this time.  Pt and spouse have been educated on safe performance of selfcare tasks and toileting.  Will need 3:1 for use at home.    OT Assessment  Patient does not need any further OT services    Follow Up Recommendations  No OT follow up       Equipment Recommendations  3 in 1 bedside comode          Precautions / Restrictions Precautions Precautions: Knee Restrictions Weight Bearing Restrictions: No LLE Weight Bearing: Weight bearing as tolerated   Pertinent Vitals/Pain Pain 3-4/10 in the right knee, nursing aware    ADL  Eating/Feeding: Performed;Independent Where Assessed - Eating/Feeding: Chair Grooming: Simulated;Supervision/safety Where Assessed - Grooming: Supported standing Upper Body Bathing: Simulated;Set up Where Assessed - Upper Body Bathing: Unsupported sitting Lower Body Bathing: Simulated;Minimal assistance Where Assessed - Lower Body Bathing: Supported sit to stand Upper Body Dressing: Simulated;Set up Where Assessed - Upper Body Dressing: Unsupported sitting Lower Body Dressing: Performed;Minimal assistance Where Assessed - Lower Body Dressing: Supported sit to stand Toilet Transfer: Chartered loss adjuster Method: Other (comment) (ambulate with RW) Science writer: Therapist, occupational and Hygiene:  Simulated;Supervision/safety Where Assessed - Best boy and Hygiene: Sit to stand from 3-in-1 or toilet Tub/Shower Transfer Method: Not assessed Equipment Used: Rolling walker;Gait belt Transfers/Ambulation Related to ADLs: Pt is able to transfer and mobilize using the RW with min guard assist. ADL Comments: Pt and husband educated on techniques to increase independence with selfcare tasks.  Pt with slight difficulty reaching her left foot for donning sock but was able to perform with increased time.  Husband will assist initially with donning shoe on the left lower extremity.      Visit Information  Last OT Received On: 05/10/13 Assistance Needed: +1 History of Present Illness: Patient is a 63 y.o. female presented with a history of pain in the left knee.  Pt underwent LTKA via Dr. Ronnie Derby.       Prior [place] expects to be discharged to:: Private residence Living Arrangements: Spouse/significant other Available Help at Discharge: Available 24 hours/day Type of Home: House Home Access: Stairs to enter [employer] of Steps: 2 Entrance Stairs-Rails: None Home Layout: Multi-level;Full bath on main level;Able to live on main level with bedroom/bathroom Home Equipment: None Prior Function Level of Independence: Independent Communication Communication: No difficulties Dominant Hand: Right         Vision/Perception Vision - History Baseline Vision: No visual deficits Patient Visual Report: No change from baseline Vision - Assessment Eye Alignment: Within Functional Limits Vision Assessment: Vision not tested Perception Perception: Within Functional Limits Praxis Praxis: Intact   Cognition  Cognition Arousal/Alertness: Awake/alert Behavior During Therapy: WFL for tasks assessed/performed Overall Cognitive Status: Within Functional Limits for tasks assessed    Extremity/Trunk Assessment Upper Extremity  Assessment Upper Extremity Assessment: Overall [facility]  for tasks assessed Lower Extremity Assessment Lower Extremity Assessment: Defer to PT evaluation Cervical / Trunk Assessment Cervical / Trunk Assessment: Normal     Mobility Transfers Transfers: Sit to Stand;Stand to Sit Sit to Stand: 4: Min guard;From toilet;With upper extremity assist Stand to Sit: 4: Min guard;To toilet;With upper extremity assist Details for Transfer Assistance: [place] for hand placement with sit to stand.        Balance Balance Balance Assessed: Yes Static Standing Balance Static Standing - Balance Support: Right upper extremity supported;Left upper extremity supported Static Standing - Level of Assistance: 5: Stand by assistance   End of Session OT - End of Session Equipment Utilized During Treatment: Rolling walker Activity Tolerance: Patient tolerated treatment well Patient left: in chair;with call bell/phone within reach Nurse Communication: Mobility status CPM Left Knee CPM Left Knee: Off     Eliakim Tendler OTR/L 05/10/2013, 10:05 AM

## 2013-05-10 NOTE — Progress Notes (Signed)
Patient d/c to home with family, IV removed, prescriptions and instructions given.

## 2013-05-10 NOTE — Progress Notes (Signed)
SPORTS MEDICINE AND JOINT REPLACEMENT  Lara Mulch, MD   Carlynn Spry, PA-C [place], [place], [place]  [address]                             [phone]   PROGRESS NOTE  Subjective:  negative for Chest Pain  negative for Shortness of Breath  negative for Nausea/Vomiting   negative for Calf Pain  negative for Bowel Movement   Tolerating Diet: yes         Patient reports pain as 3 on 0-10 scale.    Objective: Vital signs in last 24 hours:   Patient Vitals for the past 24 hrs:  BP Temp Temp src Pulse Resp SpO2  05/10/13 0500 112/50 mmHg 98 F (36.7 C) - 85 16 94 %  05/10/13 0203 113/48 mmHg 97.8 F (36.6 C) - 77 16 97 %  05/09/13 2102 122/59 mmHg 98.4 F (36.9 C) Oral 91 18 98 %  05/09/13 2006 120/55 mmHg - - - - -  05/09/13 1800 - 98.2 F (36.8 C) - - - -  05/09/13 1700 - - - 99 14 97 %  05/09/13 1645 118/56 mmHg - - 88 15 97 %  05/09/13 1630 117/48 mmHg - - 91 14 94 %  05/09/13 1615 122/49 mmHg - - 86 15 99 %  05/09/13 1600 120/61 mmHg 98.5 F (36.9 C) - 85 13 100 %  05/09/13 1545 123/49 mmHg - - 91 16 100 %  05/09/13 1530 139/53 mmHg - - 88 15 98 %  05/09/13 1515 114/54 mmHg - - 88 14 96 %  05/09/13 1500 117/52 mmHg - - 88 20 98 %  05/09/13 1445 120/50 mmHg - - 96 19 99 %  05/09/13 1430 117/56 mmHg - - 87 17 98 %  05/09/13 1415 107/50 mmHg - - 87 22 98 %  05/09/13 1400 124/77 mmHg 97.5 F (36.4 C) - 88 14 93 %  05/09/13 1055 139/58 mmHg - - 70 17 100 %  05/09/13 1050 135/54 mmHg - - 76 18 100 %  05/09/13 1042 136/54 mmHg - - 80 18 99 %  05/09/13 1040 138/56 mmHg - - 76 14 100 %  05/09/13 1035 142/61 mmHg - - 68 15 99 %  05/09/13 1030 141/55 mmHg - - 67 17 100 %  05/09/13 1025 127/66 mmHg - - 68 17 100 %  05/09/13 1020 130/63 mmHg - - 65 15 100 %  05/09/13 1015 155/59 mmHg - - 66 17 100 %  05/09/13 1010 - - - 72 12 100 %  05/09/13 0922 146/61 mmHg 97.8 F (36.6 C) Oral 66 18 97 %    @flow {1959:LAST@   Intake/Output from previous day:   01/05 0701 - 01/06 0700 In: 1500 [I.V.:1500] Out: 650 [Drains:550]   Intake/Output this shift:       Intake/Output     01/05 0701 - 01/06 0700 01/06 0701 - 01/07 0700   I.V. 1500    Total Intake 1500     Drains 550    Blood 100    Total Output 650     Net +850          Urine Occurrence 2 x       LABORATORY DATA:  Recent Labs  05/09/13 2032 05/10/13 0536  WBC 15.9* 12.0*  HGB 12.5 11.9*  HCT 36.3 35.4*  PLT 247 234    Recent Labs  05/09/13 2032 05/10/13 0536  NA  --  137  K  --  3.8  CL  --  99  CO2  --  27  BUN  --  10  CREATININE 0.66 0.61  GLUCOSE  --  95  CALCIUM  --  8.3*   Lab Results  Component Value Date   INR 0.98 04/26/2013    Examination:  General appearance: alert, cooperative and no distress Extremities: Homans sign is negative, no sign of DVT  Wound Exam: clean, dry, intact   Drainage:  None: wound tissue dry  Motor Exam: EHL and FHL Intact  Sensory Exam: Deep Peroneal normal   Assessment:    1 Day Post-Op  Procedure(s) (LRB): TOTAL KNEE ARTHROPLASTY (Left)  ADDITIONAL DIAGNOSIS:  Active Problems:   S/P total knee arthroplasty  Acute Blood Loss Anemia   Plan: Physical Therapy as ordered Weight Bearing as Tolerated (WBAT)  DVT Prophylaxis:  Lovenox  DISCHARGE PLAN: Home  DISCHARGE NEEDS: HHPT, CPM, Walker and 3-in-1 comode seat         Aureliano Oshields 05/10/2013, 7:43 AM

## 2013-05-11 ENCOUNTER — Encounter (HOSPITAL_COMMUNITY): Payer: Self-pay | Admitting: Orthopedic Surgery

## 2013-05-12 NOTE — Discharge Summary (Signed)
SPORTS MEDICINE & JOINT REPLACEMENT   Amy Mulch, MD   Amy Spry, PA-C [place], [place], [place]  [address]                             [phone]  PATIENT ID: Amy Mcbride        MRN:  [id number]          DOB/AGE: 1951/01/29 / 63 y.o.    DISCHARGE SUMMARY  ADMISSION DATE:    05/09/2013 DISCHARGE DATE:   05/10/2013  ADMISSION DIAGNOSIS: osteoarthritis left knee    DISCHARGE DIAGNOSIS:  osteoarthritis left knee    ADDITIONAL DIAGNOSIS: Active Problems:   S/P total knee arthroplasty  Past Medical History  Diagnosis Date  . Hypertension   . Arthritis   . Diverticulitis 05/2007    hospitalized    PROCEDURE: Procedure(s): TOTAL KNEE ARTHROPLASTY on 05/09/2013  CONSULTS:     HISTORY:  See H&P in chart  HOSPITAL COURSE:  Amy Mcbride is a 63 y.o. admitted on 05/09/2013 and found to have a diagnosis of osteoarthritis left knee.  After appropriate laboratory studies were obtained  they were taken to the operating room on 05/09/2013 and underwent Procedure(s): TOTAL KNEE ARTHROPLASTY.   They were given perioperative antibiotics:  Anti-infectives   Start     Dose/Rate Route Frequency Ordered Stop   05/09/13 1930  ceFAZolin (ANCEF) IVPB 2 g/50 mL premix     2 g 100 mL/hr over 30 Minutes Intravenous Every 6 hours 05/09/13 1902 05/10/13 0119   05/09/13 0600  ceFAZolin (ANCEF) IVPB 2 g/50 mL premix     2 g 100 mL/hr over 30 Minutes Intravenous On call to O.R. 05/08/13 1609 05/09/13 1150    .  Tolerated the procedure well.  Placed with a foley intraoperatively.  Given Ofirmev at induction and for 48 hours.    POD# 1: Vital signs were stable.  Patient denied Chest pain, shortness of breath, or calf pain.  Patient was started on Lovenox 30 mg subcutaneously twice daily at 8am.  Consults to PT, OT, and care management were made.  The patient was weight bearing as tolerated.  CPM was placed on the operative leg 0-90 degrees for 6-8 hours a day.  Incentive spirometry  was taught.  Dressing was changed.  Marcaine pump and hemovac were discontinued.      POD #2, Continued  PT for ambulation and exercise program.  IV saline locked.  O2 discontinued.    The remainder of the hospital course was dedicated to ambulation and strengthening.   The patient was discharged on 1 day post op in  Good condition.  Blood products given:none  DIAGNOSTIC STUDIES: Recent vital signs: No data found.      Recent laboratory studies:  Recent Labs  05/09/13 2032 05/10/13 0536  WBC 15.9* 12.0*  HGB 12.5 11.9*  HCT 36.3 35.4*  PLT 247 234    Recent Labs  05/09/13 2032 05/10/13 0536  NA  --  137  K  --  3.8  CL  --  99  CO2  --  27  BUN  --  10  CREATININE 0.66 0.61  GLUCOSE  --  95  CALCIUM  --  8.3*   Lab Results  Component Value Date   INR 0.98 04/26/2013     Recent Radiographic Studies :  Dg Chest 2 View  04/26/2013   CLINICAL DATA:  Pre operative respiratory  exam for left knee replacement surgery.  EXAM: CHEST - 2 VIEW  COMPARISON:  None  FINDINGS: The heart size and mediastinal contours are within normal limits. There is no evidence of pulmonary edema, consolidation, pneumothorax, nodule or pleural fluid. The right hemidiaphragm is mildly elevated. Mild degenerative changes are present of the thoracic spine.  IMPRESSION: No active disease.   Electronically Signed   By: Aletta Edouard M.D.   On: 04/26/2013 15:35    DISCHARGE INSTRUCTIONS: Discharge Orders   Future Orders Complete By Expires   Call MD / Call 911  As directed    Comments:     If you experience chest pain or shortness of breath, CALL 911 and be transported to the hospital emergency room.  If you develope a fever above 101 F, pus (white drainage) or increased drainage or redness at the wound, or calf pain, call your surgeon's office.   Change dressing  As directed    Comments:     Change dressing on wednesday, then change the dressing daily with sterile 4 x 4 inch gauze dressing  and apply TED hose.   Constipation Prevention  As directed    Comments:     Drink plenty of fluids.  Prune juice may be helpful.  You may use a stool softener, such as Colace (over the counter) 100 mg twice a day.  Use MiraLax (over the counter) for constipation as needed.   CPM  As directed    Comments:     Continuous passive motion machine (CPM):      Use the CPM from 0 to 90 for 6-8 hours per day.      You may increase by 10 per day.  You may break it up into 2 or 3 sessions per day.      Use CPM for 2 weeks or until you are told to stop.   Diet - low sodium heart healthy  As directed    Do not put a pillow under the knee. Place it under the heel.  As directed    Driving restrictions  As directed    Comments:     No driving for 6 weeks   Increase activity slowly as tolerated  As directed    Lifting restrictions  As directed    Comments:     No lifting for 6 weeks   TED hose  As directed    Comments:     Use stockings (TED hose) for 3 weeks on both leg(s).  You may remove them at night for sleeping.      DISCHARGE MEDICATIONS:     Medication List    STOP taking these medications       aspirin EC 81 MG tablet      TAKE these medications       Biotin 5000 MCG Caps  Take 1 capsule by mouth daily.     calcium citrate-vitamin D 315-200 MG-UNIT per tablet  Commonly known as:  CITRACAL+D  Take 1 tablet by mouth 2 (two) times daily.     Co Q-10 100 MG Caps  Take 1 capsule by mouth daily.     D3-1000 1000 UNITS capsule  Generic drug:  Cholecalciferol  Take 1,000 Units by mouth daily.     enoxaparin 40 MG/0.4ML injection  Commonly known as:  LOVENOX  Inject 0.4 mLs (40 mg total) into the skin daily.     ESTER-C/BIOFLAVONOIDS Tabs  Take by mouth. Bi-Est2mg /Prog200mg /Test1.5/DHEA1mg . This medication is compounded by [employer]  [facility] [phone]     finasteride 1 MG tablet  Commonly known as:  PROPECIA  Take 1 mg by mouth daily.     FISH OIL + D3 PO  Take 1 capsule by  mouth daily.     lisinopril-hydrochlorothiazide 10-12.5 MG per tablet  Commonly known as:  PRINZIDE,ZESTORETIC  Take 1 tablet by mouth daily.     methocarbamol 500 MG tablet  Commonly known as:  ROBAXIN  Take 1-2 tablets (500-1,000 mg total) by mouth every 6 (six) hours as needed for muscle spasms.     metroNIDAZOLE 1 % gel  Commonly known as:  METROGEL  Apply 1 application topically daily.     naproxen sodium 220 MG tablet  Commonly known as:  ANAPROX  Take 440 mg by mouth 2 (two) times daily with a meal.     OVER THE COUNTER MEDICATION  Take 1 capsule by mouth daily. Juice Plus - [place] Blend     OVER THE COUNTER MEDICATION  Take 1 drop by mouth daily. Equate Hair Regrowth Treatment for Women applied directly to scalp     oxyCODONE 5 MG immediate release tablet  Commonly known as:  Oxy IR/ROXICODONE  Take 1-2 tablets (5-10 mg total) by mouth every 3 (three) hours as needed for breakthrough pain.     pravastatin 10 MG tablet  Commonly known as:  PRAVACHOL  Take 10 mg by mouth daily.     PROGESTERONE PO  Take 50 mg by mouth at bedtime.        FOLLOW UP VISIT:       Follow-up Information   Follow up with Rudean Haskell, MD. Call on 05/24/2013.   Specialty:  Orthopedic Surgery   Contact information:   [address] [place] [address] [phone]       DISPOSITION: HOME  CONDITION:  Good   Amy Mcbride 05/12/2013, 10:47 AM

## 2013-07-07 ENCOUNTER — Other Ambulatory Visit: Payer: Self-pay | Admitting: Orthopedic Surgery

## 2013-07-07 DIAGNOSIS — M25561 Pain in right knee: Secondary | ICD-10-CM

## 2013-07-11 ENCOUNTER — Ambulatory Visit
Admission: RE | Admit: 2013-07-11 | Discharge: 2013-07-11 | Disposition: A | Discharge status: Home / Self Care | Payer: BC Managed Care – PPO | Source: Ambulatory Visit | Attending: Orthopedic Surgery | Admitting: Orthopedic Surgery

## 2013-07-11 DIAGNOSIS — M25561 Pain in right knee: Secondary | ICD-10-CM

## 2015-01-02 DIAGNOSIS — N951 Menopausal and female climacteric states: Secondary | ICD-10-CM | POA: Diagnosis not present

## 2015-01-09 DIAGNOSIS — N951 Menopausal and female climacteric states: Secondary | ICD-10-CM | POA: Diagnosis not present

## 2015-01-09 DIAGNOSIS — E538 Deficiency of other specified B group vitamins: Secondary | ICD-10-CM | POA: Diagnosis not present

## 2015-02-08 DIAGNOSIS — L820 Inflamed seborrheic keratosis: Secondary | ICD-10-CM | POA: Diagnosis not present

## 2015-02-08 DIAGNOSIS — D2239 Melanocytic nevi of other parts of face: Secondary | ICD-10-CM | POA: Diagnosis not present

## 2015-02-08 DIAGNOSIS — L719 Rosacea, unspecified: Secondary | ICD-10-CM | POA: Diagnosis not present

## 2015-02-08 DIAGNOSIS — L821 Other seborrheic keratosis: Secondary | ICD-10-CM | POA: Diagnosis not present

## 2015-02-08 DIAGNOSIS — E538 Deficiency of other specified B group vitamins: Secondary | ICD-10-CM | POA: Diagnosis not present

## 2015-02-08 DIAGNOSIS — D225 Melanocytic nevi of trunk: Secondary | ICD-10-CM | POA: Diagnosis not present

## 2015-02-08 DIAGNOSIS — D485 Neoplasm of uncertain behavior of skin: Secondary | ICD-10-CM | POA: Diagnosis not present

## 2015-02-08 DIAGNOSIS — N951 Menopausal and female climacteric states: Secondary | ICD-10-CM | POA: Diagnosis not present

## 2015-02-08 DIAGNOSIS — Z23 Encounter for immunization: Secondary | ICD-10-CM | POA: Diagnosis not present

## 2015-02-08 DIAGNOSIS — E669 Obesity, unspecified: Secondary | ICD-10-CM | POA: Diagnosis not present

## 2015-02-13 DIAGNOSIS — B370 Candidal stomatitis: Secondary | ICD-10-CM | POA: Diagnosis not present

## 2015-02-13 DIAGNOSIS — G4700 Insomnia, unspecified: Secondary | ICD-10-CM | POA: Diagnosis not present

## 2015-02-13 DIAGNOSIS — Z79899 Other long term (current) drug therapy: Secondary | ICD-10-CM | POA: Diagnosis not present

## 2015-02-13 DIAGNOSIS — L03319 Cellulitis of trunk, unspecified: Secondary | ICD-10-CM | POA: Diagnosis not present

## 2015-02-13 DIAGNOSIS — E785 Hyperlipidemia, unspecified: Secondary | ICD-10-CM | POA: Diagnosis not present

## 2015-02-13 DIAGNOSIS — I10 Essential (primary) hypertension: Secondary | ICD-10-CM | POA: Diagnosis not present

## 2015-02-14 DIAGNOSIS — Z1231 Encounter for screening mammogram for malignant neoplasm of breast: Secondary | ICD-10-CM | POA: Diagnosis not present

## 2015-03-20 DIAGNOSIS — M7581 Other shoulder lesions, right shoulder: Secondary | ICD-10-CM | POA: Diagnosis not present

## 2015-03-20 DIAGNOSIS — M25511 Pain in right shoulder: Secondary | ICD-10-CM | POA: Diagnosis not present

## 2015-04-09 DIAGNOSIS — N951 Menopausal and female climacteric states: Secondary | ICD-10-CM | POA: Diagnosis not present

## 2015-04-09 DIAGNOSIS — E039 Hypothyroidism, unspecified: Secondary | ICD-10-CM | POA: Diagnosis not present

## 2015-04-16 DIAGNOSIS — L680 Hirsutism: Secondary | ICD-10-CM | POA: Diagnosis not present

## 2015-04-16 DIAGNOSIS — E538 Deficiency of other specified B group vitamins: Secondary | ICD-10-CM | POA: Diagnosis not present

## 2015-04-16 DIAGNOSIS — N951 Menopausal and female climacteric states: Secondary | ICD-10-CM | POA: Diagnosis not present

## 2015-05-02 DIAGNOSIS — L660 Pseudopelade: Secondary | ICD-10-CM | POA: Diagnosis not present

## 2015-07-17 DIAGNOSIS — L680 Hirsutism: Secondary | ICD-10-CM | POA: Diagnosis not present

## 2015-07-23 DIAGNOSIS — N951 Menopausal and female climacteric states: Secondary | ICD-10-CM | POA: Diagnosis not present

## 2015-07-26 DIAGNOSIS — Z0000 Encounter for general adult medical examination without abnormal findings: Secondary | ICD-10-CM | POA: Diagnosis not present

## 2015-07-26 DIAGNOSIS — N951 Menopausal and female climacteric states: Secondary | ICD-10-CM | POA: Diagnosis not present

## 2015-07-26 DIAGNOSIS — E538 Deficiency of other specified B group vitamins: Secondary | ICD-10-CM | POA: Diagnosis not present

## 2015-07-26 DIAGNOSIS — L680 Hirsutism: Secondary | ICD-10-CM | POA: Diagnosis not present

## 2015-07-26 DIAGNOSIS — Z124 Encounter for screening for malignant neoplasm of cervix: Secondary | ICD-10-CM | POA: Diagnosis not present

## 2015-07-26 DIAGNOSIS — I10 Essential (primary) hypertension: Secondary | ICD-10-CM | POA: Diagnosis not present

## 2015-07-26 DIAGNOSIS — E785 Hyperlipidemia, unspecified: Secondary | ICD-10-CM | POA: Diagnosis not present

## 2015-07-26 DIAGNOSIS — Z79899 Other long term (current) drug therapy: Secondary | ICD-10-CM | POA: Diagnosis not present

## 2015-07-31 DIAGNOSIS — Z23 Encounter for immunization: Secondary | ICD-10-CM | POA: Diagnosis not present

## 2015-08-07 DIAGNOSIS — M25562 Pain in left knee: Secondary | ICD-10-CM | POA: Diagnosis not present

## 2015-08-07 DIAGNOSIS — Z96652 Presence of left artificial knee joint: Secondary | ICD-10-CM | POA: Diagnosis not present

## 2015-08-16 DIAGNOSIS — M25462 Effusion, left knee: Secondary | ICD-10-CM | POA: Diagnosis not present

## 2015-08-16 DIAGNOSIS — Z96652 Presence of left artificial knee joint: Secondary | ICD-10-CM | POA: Diagnosis not present

## 2015-09-24 DIAGNOSIS — L820 Inflamed seborrheic keratosis: Secondary | ICD-10-CM | POA: Diagnosis not present

## 2015-09-24 DIAGNOSIS — L299 Pruritus, unspecified: Secondary | ICD-10-CM | POA: Diagnosis not present

## 2015-09-24 DIAGNOSIS — L300 Nummular dermatitis: Secondary | ICD-10-CM | POA: Diagnosis not present

## 2015-10-23 DIAGNOSIS — N951 Menopausal and female climacteric states: Secondary | ICD-10-CM | POA: Diagnosis not present

## 2015-10-23 DIAGNOSIS — L680 Hirsutism: Secondary | ICD-10-CM | POA: Diagnosis not present

## 2015-10-30 DIAGNOSIS — N951 Menopausal and female climacteric states: Secondary | ICD-10-CM | POA: Diagnosis not present

## 2015-10-30 DIAGNOSIS — E538 Deficiency of other specified B group vitamins: Secondary | ICD-10-CM | POA: Diagnosis not present

## 2015-10-30 DIAGNOSIS — L680 Hirsutism: Secondary | ICD-10-CM | POA: Diagnosis not present

## 2015-11-15 DIAGNOSIS — M722 Plantar fascial fibromatosis: Secondary | ICD-10-CM | POA: Diagnosis not present

## 2015-11-15 DIAGNOSIS — M6702 Short Achilles tendon (acquired), left ankle: Secondary | ICD-10-CM | POA: Diagnosis not present

## 2015-11-15 DIAGNOSIS — M7750 Other enthesopathy of unspecified foot: Secondary | ICD-10-CM | POA: Diagnosis not present

## 2015-11-27 DIAGNOSIS — I10 Essential (primary) hypertension: Secondary | ICD-10-CM | POA: Diagnosis not present

## 2015-11-27 DIAGNOSIS — Z79899 Other long term (current) drug therapy: Secondary | ICD-10-CM | POA: Diagnosis not present

## 2015-11-27 DIAGNOSIS — Z6837 Body mass index (BMI) 37.0-37.9, adult: Secondary | ICD-10-CM | POA: Diagnosis not present

## 2015-11-27 DIAGNOSIS — K449 Diaphragmatic hernia without obstruction or gangrene: Secondary | ICD-10-CM | POA: Diagnosis not present

## 2015-12-11 DIAGNOSIS — E538 Deficiency of other specified B group vitamins: Secondary | ICD-10-CM | POA: Diagnosis not present

## 2015-12-11 DIAGNOSIS — L680 Hirsutism: Secondary | ICD-10-CM | POA: Diagnosis not present

## 2015-12-11 DIAGNOSIS — N951 Menopausal and female climacteric states: Secondary | ICD-10-CM | POA: Diagnosis not present

## 2016-01-03 DIAGNOSIS — K219 Gastro-esophageal reflux disease without esophagitis: Secondary | ICD-10-CM | POA: Diagnosis not present

## 2016-01-04 DIAGNOSIS — T85898A Other specified complication of other internal prosthetic devices, implants and grafts, initial encounter: Secondary | ICD-10-CM | POA: Diagnosis not present

## 2016-01-04 DIAGNOSIS — E785 Hyperlipidemia, unspecified: Secondary | ICD-10-CM | POA: Diagnosis not present

## 2016-01-04 DIAGNOSIS — I252 Old myocardial infarction: Secondary | ICD-10-CM | POA: Diagnosis not present

## 2016-01-04 DIAGNOSIS — I10 Essential (primary) hypertension: Secondary | ICD-10-CM | POA: Diagnosis not present

## 2016-01-04 DIAGNOSIS — Z6836 Body mass index (BMI) 36.0-36.9, adult: Secondary | ICD-10-CM | POA: Diagnosis not present

## 2016-01-04 DIAGNOSIS — R9431 Abnormal electrocardiogram [ECG] [EKG]: Secondary | ICD-10-CM | POA: Diagnosis not present

## 2016-01-04 DIAGNOSIS — R12 Heartburn: Secondary | ICD-10-CM | POA: Diagnosis not present

## 2016-01-30 DIAGNOSIS — Z1231 Encounter for screening mammogram for malignant neoplasm of breast: Secondary | ICD-10-CM | POA: Diagnosis not present

## 2016-01-30 DIAGNOSIS — I10 Essential (primary) hypertension: Secondary | ICD-10-CM | POA: Diagnosis not present

## 2016-01-30 DIAGNOSIS — E785 Hyperlipidemia, unspecified: Secondary | ICD-10-CM | POA: Diagnosis not present

## 2016-01-30 DIAGNOSIS — Z79899 Other long term (current) drug therapy: Secondary | ICD-10-CM | POA: Diagnosis not present

## 2016-01-30 DIAGNOSIS — Z6835 Body mass index (BMI) 35.0-35.9, adult: Secondary | ICD-10-CM | POA: Diagnosis not present

## 2016-01-30 DIAGNOSIS — G4700 Insomnia, unspecified: Secondary | ICD-10-CM | POA: Diagnosis not present

## 2016-01-31 DIAGNOSIS — Z23 Encounter for immunization: Secondary | ICD-10-CM | POA: Diagnosis not present

## 2016-02-04 DIAGNOSIS — L821 Other seborrheic keratosis: Secondary | ICD-10-CM | POA: Diagnosis not present

## 2016-02-04 DIAGNOSIS — L820 Inflamed seborrheic keratosis: Secondary | ICD-10-CM | POA: Diagnosis not present

## 2016-02-04 DIAGNOSIS — D1801 Hemangioma of skin and subcutaneous tissue: Secondary | ICD-10-CM | POA: Diagnosis not present

## 2016-02-04 DIAGNOSIS — D2239 Melanocytic nevi of other parts of face: Secondary | ICD-10-CM | POA: Diagnosis not present

## 2016-02-04 DIAGNOSIS — D225 Melanocytic nevi of trunk: Secondary | ICD-10-CM | POA: Diagnosis not present

## 2016-02-19 DIAGNOSIS — Z1231 Encounter for screening mammogram for malignant neoplasm of breast: Secondary | ICD-10-CM | POA: Diagnosis not present

## 2016-02-25 DIAGNOSIS — H524 Presbyopia: Secondary | ICD-10-CM | POA: Diagnosis not present

## 2016-02-27 DIAGNOSIS — M9903 Segmental and somatic dysfunction of lumbar region: Secondary | ICD-10-CM | POA: Diagnosis not present

## 2016-02-27 DIAGNOSIS — M9901 Segmental and somatic dysfunction of cervical region: Secondary | ICD-10-CM | POA: Diagnosis not present

## 2016-02-27 DIAGNOSIS — S338XXA Sprain of other parts of lumbar spine and pelvis, initial encounter: Secondary | ICD-10-CM | POA: Diagnosis not present

## 2016-02-27 DIAGNOSIS — M9902 Segmental and somatic dysfunction of thoracic region: Secondary | ICD-10-CM | POA: Diagnosis not present

## 2016-02-27 DIAGNOSIS — M25511 Pain in right shoulder: Secondary | ICD-10-CM | POA: Diagnosis not present

## 2016-02-28 DIAGNOSIS — M25511 Pain in right shoulder: Secondary | ICD-10-CM | POA: Diagnosis not present

## 2016-02-28 DIAGNOSIS — M9903 Segmental and somatic dysfunction of lumbar region: Secondary | ICD-10-CM | POA: Diagnosis not present

## 2016-02-28 DIAGNOSIS — M9902 Segmental and somatic dysfunction of thoracic region: Secondary | ICD-10-CM | POA: Diagnosis not present

## 2016-02-28 DIAGNOSIS — M9901 Segmental and somatic dysfunction of cervical region: Secondary | ICD-10-CM | POA: Diagnosis not present

## 2016-02-28 DIAGNOSIS — S338XXA Sprain of other parts of lumbar spine and pelvis, initial encounter: Secondary | ICD-10-CM | POA: Diagnosis not present

## 2016-03-03 DIAGNOSIS — M9901 Segmental and somatic dysfunction of cervical region: Secondary | ICD-10-CM | POA: Diagnosis not present

## 2016-03-03 DIAGNOSIS — M9902 Segmental and somatic dysfunction of thoracic region: Secondary | ICD-10-CM | POA: Diagnosis not present

## 2016-03-03 DIAGNOSIS — M9903 Segmental and somatic dysfunction of lumbar region: Secondary | ICD-10-CM | POA: Diagnosis not present

## 2016-03-03 DIAGNOSIS — S338XXA Sprain of other parts of lumbar spine and pelvis, initial encounter: Secondary | ICD-10-CM | POA: Diagnosis not present

## 2016-03-03 DIAGNOSIS — M25511 Pain in right shoulder: Secondary | ICD-10-CM | POA: Diagnosis not present

## 2016-03-05 DIAGNOSIS — M9903 Segmental and somatic dysfunction of lumbar region: Secondary | ICD-10-CM | POA: Diagnosis not present

## 2016-03-05 DIAGNOSIS — M9902 Segmental and somatic dysfunction of thoracic region: Secondary | ICD-10-CM | POA: Diagnosis not present

## 2016-03-05 DIAGNOSIS — M9901 Segmental and somatic dysfunction of cervical region: Secondary | ICD-10-CM | POA: Diagnosis not present

## 2016-03-05 DIAGNOSIS — S338XXA Sprain of other parts of lumbar spine and pelvis, initial encounter: Secondary | ICD-10-CM | POA: Diagnosis not present

## 2016-03-05 DIAGNOSIS — M25511 Pain in right shoulder: Secondary | ICD-10-CM | POA: Diagnosis not present

## 2016-03-06 DIAGNOSIS — M25511 Pain in right shoulder: Secondary | ICD-10-CM | POA: Diagnosis not present

## 2016-03-06 DIAGNOSIS — M9902 Segmental and somatic dysfunction of thoracic region: Secondary | ICD-10-CM | POA: Diagnosis not present

## 2016-03-06 DIAGNOSIS — S338XXA Sprain of other parts of lumbar spine and pelvis, initial encounter: Secondary | ICD-10-CM | POA: Diagnosis not present

## 2016-03-06 DIAGNOSIS — M9903 Segmental and somatic dysfunction of lumbar region: Secondary | ICD-10-CM | POA: Diagnosis not present

## 2016-03-06 DIAGNOSIS — M9901 Segmental and somatic dysfunction of cervical region: Secondary | ICD-10-CM | POA: Diagnosis not present

## 2016-03-11 DIAGNOSIS — M9902 Segmental and somatic dysfunction of thoracic region: Secondary | ICD-10-CM | POA: Diagnosis not present

## 2016-03-11 DIAGNOSIS — M25511 Pain in right shoulder: Secondary | ICD-10-CM | POA: Diagnosis not present

## 2016-03-11 DIAGNOSIS — M9901 Segmental and somatic dysfunction of cervical region: Secondary | ICD-10-CM | POA: Diagnosis not present

## 2016-03-11 DIAGNOSIS — M9903 Segmental and somatic dysfunction of lumbar region: Secondary | ICD-10-CM | POA: Diagnosis not present

## 2016-03-11 DIAGNOSIS — S338XXA Sprain of other parts of lumbar spine and pelvis, initial encounter: Secondary | ICD-10-CM | POA: Diagnosis not present

## 2016-03-13 DIAGNOSIS — M9903 Segmental and somatic dysfunction of lumbar region: Secondary | ICD-10-CM | POA: Diagnosis not present

## 2016-03-13 DIAGNOSIS — S338XXA Sprain of other parts of lumbar spine and pelvis, initial encounter: Secondary | ICD-10-CM | POA: Diagnosis not present

## 2016-03-13 DIAGNOSIS — M25511 Pain in right shoulder: Secondary | ICD-10-CM | POA: Diagnosis not present

## 2016-03-13 DIAGNOSIS — M9901 Segmental and somatic dysfunction of cervical region: Secondary | ICD-10-CM | POA: Diagnosis not present

## 2016-03-13 DIAGNOSIS — M9902 Segmental and somatic dysfunction of thoracic region: Secondary | ICD-10-CM | POA: Diagnosis not present

## 2016-03-20 DIAGNOSIS — M9901 Segmental and somatic dysfunction of cervical region: Secondary | ICD-10-CM | POA: Diagnosis not present

## 2016-03-20 DIAGNOSIS — M25511 Pain in right shoulder: Secondary | ICD-10-CM | POA: Diagnosis not present

## 2016-03-20 DIAGNOSIS — M9903 Segmental and somatic dysfunction of lumbar region: Secondary | ICD-10-CM | POA: Diagnosis not present

## 2016-03-20 DIAGNOSIS — S338XXA Sprain of other parts of lumbar spine and pelvis, initial encounter: Secondary | ICD-10-CM | POA: Diagnosis not present

## 2016-03-20 DIAGNOSIS — M9902 Segmental and somatic dysfunction of thoracic region: Secondary | ICD-10-CM | POA: Diagnosis not present

## 2016-03-25 DIAGNOSIS — S338XXA Sprain of other parts of lumbar spine and pelvis, initial encounter: Secondary | ICD-10-CM | POA: Diagnosis not present

## 2016-03-25 DIAGNOSIS — L680 Hirsutism: Secondary | ICD-10-CM | POA: Diagnosis not present

## 2016-03-25 DIAGNOSIS — M9902 Segmental and somatic dysfunction of thoracic region: Secondary | ICD-10-CM | POA: Diagnosis not present

## 2016-03-25 DIAGNOSIS — N951 Menopausal and female climacteric states: Secondary | ICD-10-CM | POA: Diagnosis not present

## 2016-03-25 DIAGNOSIS — M25511 Pain in right shoulder: Secondary | ICD-10-CM | POA: Diagnosis not present

## 2016-03-25 DIAGNOSIS — M9903 Segmental and somatic dysfunction of lumbar region: Secondary | ICD-10-CM | POA: Diagnosis not present

## 2016-03-25 DIAGNOSIS — M9901 Segmental and somatic dysfunction of cervical region: Secondary | ICD-10-CM | POA: Diagnosis not present

## 2016-04-01 DIAGNOSIS — N951 Menopausal and female climacteric states: Secondary | ICD-10-CM | POA: Diagnosis not present

## 2016-04-01 DIAGNOSIS — E538 Deficiency of other specified B group vitamins: Secondary | ICD-10-CM | POA: Diagnosis not present

## 2016-04-01 DIAGNOSIS — L680 Hirsutism: Secondary | ICD-10-CM | POA: Diagnosis not present

## 2016-05-03 ENCOUNTER — Encounter (HOSPITAL_COMMUNITY): Payer: Self-pay | Admitting: Emergency Medicine

## 2016-05-03 ENCOUNTER — Emergency Department (HOSPITAL_BASED_OUTPATIENT_CLINIC_OR_DEPARTMENT_OTHER)
Admit: 2016-05-03 | Discharge: 2016-05-03 | Disposition: A | Discharge status: Home / Self Care | Payer: Medicare Other | Attending: Emergency Medicine | Admitting: Emergency Medicine

## 2016-05-03 ENCOUNTER — Emergency Department (HOSPITAL_COMMUNITY)
Admission: EM | Admit: 2016-05-03 | Discharge: 2016-05-03 | Disposition: A | Discharge status: Home / Self Care | Payer: Medicare Other | Attending: Emergency Medicine | Admitting: Emergency Medicine

## 2016-05-03 DIAGNOSIS — M79661 Pain in right lower leg: Secondary | ICD-10-CM

## 2016-05-03 DIAGNOSIS — Y999 Unspecified external cause status: Secondary | ICD-10-CM | POA: Diagnosis not present

## 2016-05-03 DIAGNOSIS — Y939 Activity, unspecified: Secondary | ICD-10-CM | POA: Insufficient documentation

## 2016-05-03 DIAGNOSIS — I10 Essential (primary) hypertension: Secondary | ICD-10-CM | POA: Insufficient documentation

## 2016-05-03 DIAGNOSIS — Y929 Unspecified place or not applicable: Secondary | ICD-10-CM | POA: Diagnosis not present

## 2016-05-03 DIAGNOSIS — X501XXA Overexertion from prolonged static or awkward postures, initial encounter: Secondary | ICD-10-CM | POA: Diagnosis not present

## 2016-05-03 DIAGNOSIS — M79609 Pain in unspecified limb: Secondary | ICD-10-CM | POA: Diagnosis not present

## 2016-05-03 DIAGNOSIS — Z96652 Presence of left artificial knee joint: Secondary | ICD-10-CM | POA: Insufficient documentation

## 2016-05-03 DIAGNOSIS — S8991XA Unspecified injury of right lower leg, initial encounter: Secondary | ICD-10-CM | POA: Diagnosis present

## 2016-05-03 DIAGNOSIS — S86111A Strain of other muscle(s) and tendon(s) of posterior muscle group at lower leg level, right leg, initial encounter: Secondary | ICD-10-CM | POA: Diagnosis not present

## 2016-05-03 DIAGNOSIS — Z79899 Other long term (current) drug therapy: Secondary | ICD-10-CM | POA: Insufficient documentation

## 2016-05-03 DIAGNOSIS — S86911A Strain of unspecified muscle(s) and tendon(s) at lower leg level, right leg, initial encounter: Secondary | ICD-10-CM | POA: Insufficient documentation

## 2016-05-03 DIAGNOSIS — M7989 Other specified soft tissue disorders: Secondary | ICD-10-CM

## 2016-05-03 DIAGNOSIS — S86811A Strain of other muscle(s) and tendon(s) at lower leg level, right leg, initial encounter: Secondary | ICD-10-CM

## 2016-05-03 MED ORDER — CYCLOBENZAPRINE HCL 5 MG PO TABS: 5.0000 mg | ORAL_TABLET | Freq: Three times a day (TID) | ORAL | 0 refills | Status: DC | PRN

## 2016-05-03 NOTE — ED Provider Notes (Signed)
[facility] DEPT Provider Note   CSN: [id number] Arrival date & time: 05/03/16  1652     History   Chief Complaint Chief Complaint  Patient presents with  . Leg Pain    HPI Amy Mcbride is a 65 y.o. female issue of hypertension, diverticulitis here presenting with right calf pain. Patient has right calf pain acute onset about 5 days ago. Patient denies any recent travel but has been standing up a lot preparing Christmas dinner. Denies any chest pain or shortness of breath. Patient has been taking Motrin with minimal relief. Denies any numbness or tingling to the leg. Denies hx of DVT/PE. Denies any trauma or injury   The history is provided by the patient.    Past Medical History:  Diagnosis Date  . Arthritis   . Diverticulitis 05/2007   hospitalized  . Hypertension     Patient Active Problem List   Diagnosis Date Noted  . 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 CESAREAN SECTION     x2  . KNEE ARTHROSCOPY Left    torn menisicus  . TOTAL KNEE ARTHROPLASTY Left 05/09/2013   Procedure: TOTAL KNEE ARTHROPLASTY;  Surgeon: Vickey Huger, MD;  Location: [facility];  Service: Orthopedics;  Laterality: Left;  . WRIST SURGERY Left    gangilon cyst    OB History    No data available       Home Medications    Prior to Admission medications   Medication Sig Start Date End Date Taking? Authorizing Provider  Bioflavonoid Products (ESTER-C/BIOFLAVONOIDS) TABS Take by mouth. Bi-Est2mg /Prog200mg /Test1.5/DHEA1mg . This medication is compounded by [facility] [phone]    Historical Provider, MD  Biotin 5000 MCG CAPS Take 1 capsule by mouth daily.    Historical Provider, MD  calcium citrate-vitamin D (CITRACAL+D) 315-200 MG-UNIT per tablet Take 1 tablet by mouth 2 (two) times daily.    Historical Provider, MD  Cholecalciferol (D3-1000) 1000 UNITS capsule Take 1,000 Units by mouth daily.    Historical Provider, MD  Coenzyme Q10  (CO Q-10) 100 MG CAPS Take 1 capsule by mouth daily.    Historical Provider, MD  cyclobenzaprine (FLEXERIL) 5 MG tablet Take 1 tablet (5 mg total) by mouth 3 (three) times daily as needed for muscle spasms. 05/03/16   Drenda Freeze, MD  enoxaparin (LOVENOX) 40 MG/0.4ML injection Inject 0.4 mLs (40 mg total) into the skin daily. 05/10/13   Carlynn Spry, PA-C  finasteride (PROPECIA) 1 MG tablet Take 1 mg by mouth daily.    Historical Provider, MD  Fish Oil-Cholecalciferol (FISH OIL + D3 PO) Take 1 capsule by mouth daily.    Historical Provider, MD  lisinopril-hydrochlorothiazide (PRINZIDE,ZESTORETIC) 10-12.5 MG per tablet Take 1 tablet by mouth daily.    Historical Provider, MD  methocarbamol (ROBAXIN) 500 MG tablet Take 1-2 tablets (500-1,000 mg total) by mouth every 6 (six) hours as needed for muscle spasms. 05/10/13   Carlynn Spry, PA-C  metroNIDAZOLE (METROGEL) 1 % gel Apply 1 application topically daily.    Historical Provider, MD  naproxen sodium (ANAPROX) 220 MG tablet Take 440 mg by mouth 2 (two) times daily with a meal.    Historical Provider, MD  OVER THE COUNTER MEDICATION Take 1 capsule by mouth daily. Juice Plus - Orchard Blend    Historical Provider, MD  OVER THE COUNTER MEDICATION Take 1 drop by mouth daily. Equate Hair Regrowth Treatment for Women applied directly to scalp  Historical Provider, MD  oxyCODONE (OXY IR/ROXICODONE) 5 MG immediate release tablet Take 1-2 tablets (5-10 mg total) by mouth every 3 (three) hours as needed for breakthrough pain. 05/10/13   Carlynn Spry, PA-C  pravastatin (PRAVACHOL) 10 MG tablet Take 10 mg by mouth daily.    Historical Provider, MD  Progesterone Micronized (PROGESTERONE PO) Take 50 mg by mouth at bedtime.    Historical Provider, MD    Family History No family history on file.  Social History Social History  Substance Use Topics  . Smoking status: Never Smoker  . Smokeless tobacco: Not on file  . Alcohol use 1.2 oz/week    2 Glasses of  wine per week     Allergies   Patient has no known allergies.   Review of Systems Review of Systems  Musculoskeletal:       R calf pain   All other systems reviewed and are negative.    Physical Exam Updated Vital Signs BP 163/75 (BP Location: Left Arm)   Pulse 103   Temp 98 F (36.7 C) (Oral)   Resp 18   Ht 5\' 2"  (1.575 m)   Wt 213 lb 4 oz (96.7 kg)   SpO2 99%   BMI 39.00 kg/m   Physical Exam  Constitutional: She appears well-developed.  HENT:  Head: Normocephalic.  Eyes: Pupils are equal, round, and reactive to light.  Neck: Normal range of motion. Neck supple.  Cardiovascular: Normal rate, regular rhythm and normal heart sounds.   Pulmonary/Chest: Effort normal and breath sounds normal.  Abdominal: Soft. Bowel sounds are normal. She exhibits no distension.  Musculoskeletal:  R calf tender. 2+ pulses, achilles tendon intact. Able to bear weight on the leg   Neurological: She is alert.  Skin: Skin is warm.  Psychiatric: She has a normal mood and affect.  Nursing note and vitals reviewed.    ED Treatments / Results  Labs (all labs ordered are listed, but only abnormal results are displayed) Labs Reviewed - No data to display  EKG  EKG Interpretation None       Radiology No results found.  Procedures Procedures (including critical care time)  Medications Ordered in ED Medications - No data to display   Initial Impression / Assessment and Plan / ED Course  I have reviewed the triage vital signs and the nursing notes.  Pertinent labs & imaging results that were available during my care of the patient were reviewed by me and considered in my medical decision making (see chart for details).  Clinical Course    Amy Mcbride is a 65 y.o. female here with R calf pain. Likely muscle strain. DVT study neg. Has stable vitals, no signs of PE. She is afebrile, well appearing, I doubt rhabdomyolysis. No signs of cellulitis, neurovascular intact. Told her  to stay off her leg, raise her legs, take motrin, prn flexeril.    Final Clinical Impressions(s) / ED Diagnoses   Final diagnoses:  Right calf pain  Strain of calf muscle, right, initial encounter    New Prescriptions New Prescriptions   CYCLOBENZAPRINE (FLEXERIL) 5 MG TABLET    Take 1 tablet (5 mg total) by mouth 3 (three) times daily as needed for muscle spasms.     Drenda Freeze, MD 05/04/16 [id number]

## 2016-05-03 NOTE — ED Triage Notes (Signed)
Pt reports right calf tenderness, tightness and soreness starting on Dec 25th. Pts husband reports leg being "hard as a rock." Compared to left leg calf is swollen. Only pain when touching.

## 2016-05-03 NOTE — Progress Notes (Signed)
VASCULAR LAB PRELIMINARY  PRELIMINARY  PRELIMINARY  PRELIMINARY  Right lower extremity venous duplex completed.    Preliminary report:  There is no DVT or SVT noted in the right lower extremity.    Tilton Marsalis, RVT 05/03/2016, 6:35 PM

## 2016-05-03 NOTE — ED Notes (Signed)
Reviewed DC instruction with patient and family , verbalized understanding

## 2016-05-03 NOTE — Discharge Instructions (Signed)
Continue motrin or alleve.   Take flexeril for calf pain. You likely strained your calf muscles.   Try and rest and get off your leg. Elevate your leg.   See your doctor  Return to ER if you have severe calf pain, chest pain, shortness of breath, leg numbness or tingling or weakness.

## 2016-05-30 DIAGNOSIS — Z471 Aftercare following joint replacement surgery: Secondary | ICD-10-CM | POA: Diagnosis not present

## 2016-05-30 DIAGNOSIS — Z96651 Presence of right artificial knee joint: Secondary | ICD-10-CM | POA: Diagnosis not present

## 2016-05-30 DIAGNOSIS — S86811A Strain of other muscle(s) and tendon(s) at lower leg level, right leg, initial encounter: Secondary | ICD-10-CM | POA: Diagnosis not present

## 2016-07-02 DIAGNOSIS — N951 Menopausal and female climacteric states: Secondary | ICD-10-CM | POA: Diagnosis not present

## 2016-07-07 DIAGNOSIS — N951 Menopausal and female climacteric states: Secondary | ICD-10-CM | POA: Diagnosis not present

## 2016-07-29 DIAGNOSIS — Z0001 Encounter for general adult medical examination with abnormal findings: Secondary | ICD-10-CM | POA: Diagnosis not present

## 2016-07-29 DIAGNOSIS — I10 Essential (primary) hypertension: Secondary | ICD-10-CM | POA: Diagnosis not present

## 2016-07-29 DIAGNOSIS — Z79899 Other long term (current) drug therapy: Secondary | ICD-10-CM | POA: Diagnosis not present

## 2016-07-29 DIAGNOSIS — Z23 Encounter for immunization: Secondary | ICD-10-CM | POA: Diagnosis not present

## 2016-07-29 DIAGNOSIS — Z1389 Encounter for screening for other disorder: Secondary | ICD-10-CM | POA: Diagnosis not present

## 2016-07-29 DIAGNOSIS — E049 Nontoxic goiter, unspecified: Secondary | ICD-10-CM | POA: Diagnosis not present

## 2016-07-29 DIAGNOSIS — E785 Hyperlipidemia, unspecified: Secondary | ICD-10-CM | POA: Diagnosis not present

## 2016-07-29 DIAGNOSIS — Z6838 Body mass index (BMI) 38.0-38.9, adult: Secondary | ICD-10-CM | POA: Diagnosis not present

## 2016-07-30 DIAGNOSIS — H6123 Impacted cerumen, bilateral: Secondary | ICD-10-CM | POA: Diagnosis not present

## 2016-08-12 ENCOUNTER — Emergency Department (HOSPITAL_COMMUNITY)
Admission: EM | Admit: 2016-08-12 | Discharge: 2016-08-12 | Disposition: A | Discharge status: Home / Self Care | Payer: Medicare Other | Attending: Emergency Medicine | Admitting: Emergency Medicine

## 2016-08-12 ENCOUNTER — Encounter (HOSPITAL_COMMUNITY): Payer: Self-pay | Admitting: Radiology

## 2016-08-12 ENCOUNTER — Emergency Department (HOSPITAL_COMMUNITY): Payer: Medicare Other

## 2016-08-12 DIAGNOSIS — Z79899 Other long term (current) drug therapy: Secondary | ICD-10-CM | POA: Diagnosis not present

## 2016-08-12 DIAGNOSIS — N200 Calculus of kidney: Secondary | ICD-10-CM | POA: Diagnosis not present

## 2016-08-12 DIAGNOSIS — I10 Essential (primary) hypertension: Secondary | ICD-10-CM | POA: Insufficient documentation

## 2016-08-12 DIAGNOSIS — R319 Hematuria, unspecified: Secondary | ICD-10-CM

## 2016-08-12 DIAGNOSIS — R1013 Epigastric pain: Secondary | ICD-10-CM | POA: Diagnosis present

## 2016-08-12 DIAGNOSIS — Z7982 Long term (current) use of aspirin: Secondary | ICD-10-CM | POA: Insufficient documentation

## 2016-08-12 DIAGNOSIS — K5730 Diverticulosis of large intestine without perforation or abscess without bleeding: Secondary | ICD-10-CM | POA: Diagnosis not present

## 2016-08-12 DIAGNOSIS — Z96652 Presence of left artificial knee joint: Secondary | ICD-10-CM | POA: Diagnosis not present

## 2016-08-12 LAB — CBC WITH DIFFERENTIAL/PLATELET
Basophils Absolute: 0 10*3/uL (ref 0.0–0.1)
Basophils Relative: 0 %
Eosinophils Absolute: 0 10*3/uL (ref 0.0–0.7)
Eosinophils Relative: 0 %
HCT: 41.5 % (ref 36.0–46.0)
Hemoglobin: 14.6 g/dL (ref 12.0–15.0)
LYMPHS ABS: 1.3 10*3/uL (ref 0.7–4.0)
Lymphocytes Relative: 14 %
MCH: 30.7 pg (ref 26.0–34.0)
MCHC: 35.2 g/dL (ref 30.0–36.0)
MCV: 87.4 fL (ref 78.0–100.0)
Monocytes Absolute: 0.5 10*3/uL (ref 0.1–1.0)
Monocytes Relative: 5 %
Neutro Abs: 7.6 10*3/uL (ref 1.7–7.7)
Neutrophils Relative %: 81 %
Platelets: 252 10*3/uL (ref 150–400)
RBC: 4.75 MIL/uL (ref 3.87–5.11)
RDW: 12.8 % (ref 11.5–15.5)
WBC: 9.5 10*3/uL (ref 4.0–10.5)

## 2016-08-12 LAB — URINALYSIS, ROUTINE W REFLEX MICROSCOPIC
Bilirubin Urine: NEGATIVE
Glucose, UA: NEGATIVE mg/dL
KETONES UR: NEGATIVE mg/dL
Leukocytes, UA: NEGATIVE
Nitrite: NEGATIVE
PROTEIN: NEGATIVE mg/dL
Specific Gravity, Urine: 1.017 (ref 1.005–1.030)
pH: 6 (ref 5.0–8.0)

## 2016-08-12 LAB — COMPREHENSIVE METABOLIC PANEL
ALT: 17 U/L (ref 14–54)
AST: 24 U/L (ref 15–41)
Albumin: 3.9 g/dL (ref 3.5–5.0)
Alkaline Phosphatase: 67 U/L (ref 38–126)
Anion gap: 7 (ref 5–15)
BUN: 16 mg/dL (ref 6–20)
CO2: 24 mmol/L (ref 22–32)
CREATININE: 0.89 mg/dL (ref 0.44–1.00)
Calcium: 9.4 mg/dL (ref 8.9–10.3)
Chloride: 108 mmol/L (ref 101–111)
GFR calc non Af Amer: >60 mL/min (ref >60)
Glucose, Bld: 119 mg/dL — ABNORMAL HIGH (ref 65–99)
Potassium: 3.3 mmol/L — ABNORMAL LOW (ref 3.5–5.1)
SODIUM: 139 mmol/L (ref 135–145)
Total Bilirubin: 0.8 mg/dL (ref 0.3–1.2)
Total Protein: 7.4 g/dL (ref 6.5–8.1)

## 2016-08-12 LAB — LIPASE, BLOOD: Lipase: 23 U/L (ref 11–51)

## 2016-08-12 MED ORDER — SODIUM CHLORIDE 0.9 % IV SOLN
Freq: Once | INTRAVENOUS | Status: AC
  Administered 2016-08-12: 08:00:00 via INTRAVENOUS

## 2016-08-12 MED ORDER — HYDROCODONE-ACETAMINOPHEN 5-325 MG PO TABS: 1.0000 | ORAL_TABLET | ORAL | 0 refills | Status: DC | PRN

## 2016-08-12 MED ORDER — IBUPROFEN 600 MG PO TABS: 600.0000 mg | ORAL_TABLET | Freq: Four times a day (QID) | ORAL | 0 refills | Status: AC | PRN

## 2016-08-12 MED ORDER — IOPAMIDOL (ISOVUE-300) INJECTION 61%
INTRAVENOUS | Status: AC
  Filled 2016-08-12: qty 100

## 2016-08-12 MED ORDER — IOPAMIDOL (ISOVUE-300) INJECTION 61%
30.0000 mL | Freq: Once | INTRAVENOUS | Status: AC | PRN
  Administered 2016-08-12: 30 mL via ORAL

## 2016-08-12 MED ORDER — IOPAMIDOL (ISOVUE-300) INJECTION 61%
100.0000 mL | Freq: Once | INTRAVENOUS | Status: AC | PRN
  Administered 2016-08-12: 100 mL via INTRAVENOUS

## 2016-08-12 MED ORDER — IOPAMIDOL (ISOVUE-300) INJECTION 61%
INTRAVENOUS | Status: AC
  Administered 2016-08-12: 30 mL via ORAL
  Filled 2016-08-12: qty 30

## 2016-08-12 NOTE — ED Triage Notes (Signed)
Pt c/o mid abdominal pain gradually moving down to the right lower quadrant and right lower back throughout the morning. Pt states she felt as if she needed to have a bowel movement and had a huge bowel movement yesterday. Pain partially relieved by urinating just prior to triage.

## 2016-08-12 NOTE — ED Provider Notes (Signed)
[facility] DEPT Provider Note   CSN: [id number] Arrival date & time: 08/12/16  5400     History   Chief Complaint Chief Complaint  Patient presents with  . Abdominal Pain    HPI VERDEAN MURIN is a 66 y.o. female.  HPI Patient reports during the night she started to get pain in her abdomen. She reports it was a fairly severe pain in her lower abdomen. She awakened and thought maybe she had needed to urinate badly. She went in and urinated but the pain continued to migrate on the right side of her abdomen. She reports at one point in time it was significantly into her back. She states she tried to go the bathroom several times however this did not alleviate her pain. She reports she had a very large bowel movement yesterday. She has not been having any constipation. She has been under a lot of stress over the past 2 weeks. Both parents died within 10 days. She reports she was noting burning and aching discomfort in her epigastrium and had been wondering if she was developing an ulcer. Patient still has her gallbladder. She has a history of diverticulitis. Past Medical History:  Diagnosis Date  . Arthritis   . Diverticulitis 05/2007   hospitalized  . Hypertension     Patient Active Problem List   Diagnosis Date Noted  . 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 CESAREAN SECTION     x2  . KNEE ARTHROSCOPY Left    torn menisicus  . TOTAL KNEE ARTHROPLASTY Left 05/09/2013   Procedure: TOTAL KNEE ARTHROPLASTY;  Surgeon: Vickey Huger, MD;  Location: [facility];  Service: Orthopedics;  Laterality: Left;  . WRIST SURGERY Left    gangilon cyst    OB History    No data available       Home Medications    Prior to Admission medications   Medication Sig Start Date End Date Taking? Authorizing Provider  aspirin EC 81 MG tablet Take 81 mg by mouth at bedtime.   Yes Historical Provider, MD  Cholecalciferol (D3-1000) 1000 UNITS  capsule Take 1,000 Units by mouth daily.   Yes Historical Provider, MD  Fish Oil-Cholecalciferol (FISH OIL + D3 PO) Take 1 capsule by mouth daily.   Yes Historical Provider, MD  lisinopril-hydrochlorothiazide (PRINZIDE,ZESTORETIC) 10-12.5 MG per tablet Take 1 tablet by mouth daily with breakfast.    Yes Historical Provider, MD  magnesium gluconate (MAGONATE) 500 MG tablet Take 500 mg by mouth at bedtime.   Yes Historical Provider, MD  NONFORMULARY OR COMPOUNDED ITEM Progesterone 200mg  - 1 capsule by mouth at night time (Hormone Replacement)   Yes Historical Provider, MD  pravastatin (PRAVACHOL) 10 MG tablet Take 10 mg by mouth at bedtime.    Yes Historical Provider, MD  PRESCRIPTION MEDICATION every 3 (three) months. Testosterone Pellet (Hormone Replacement) inserted at dr's office   Yes Historical Provider, MD  topiramate (TOPAMAX) 50 MG tablet Take 50 mg by mouth at bedtime. 07/29/16  Yes Historical Provider, MD  HYDROcodone-acetaminophen (NORCO/VICODIN) 5-325 MG tablet Take 1-2 tablets by mouth every 4 (four) hours as needed for moderate pain or severe pain. 08/12/16   Charlesetta Shanks, MD  ibuprofen (ADVIL,MOTRIN) 600 MG tablet Take 1 tablet (600 mg total) by mouth every 6 (six) hours as needed. 08/12/16   Charlesetta Shanks, MD    Family History No family history on file.  Social History Social History  Substance  Use Topics  . Smoking status: Never Smoker  . Smokeless tobacco: Not on file  . Alcohol use 1.2 oz/week    2 Glasses of wine per week     Allergies   Patient has no known allergies.   Review of Systems Review of Systems 10 Systems reviewed and are negative for acute change except as noted in the HPI.  Physical Exam Updated Vital Signs BP (!) 110/43 (BP Location: Left Arm)   Pulse 66   Temp 98 F (36.7 C) (Oral)   Resp 14   Ht 5\' 3"  (1.6 m)   Wt 213 lb (96.6 kg)   SpO2 98%   BMI 37.73 kg/m   Physical Exam  Constitutional: She is oriented to person, place, and time.  She appears well-developed and well-nourished. No distress.  HENT:  Head: Normocephalic and atraumatic.  Mouth/Throat: Oropharynx is clear and moist.  Eyes: Conjunctivae and EOM are normal. Pupils are equal, round, and reactive to light.  Neck: Neck supple.  Cardiovascular: Normal rate and regular rhythm.   No murmur heard. Pulmonary/Chest: Effort normal and breath sounds normal. No respiratory distress.  Abdominal: Soft. She exhibits no distension. There is tenderness.  Mild to moderate right lower quadrant tenderness without guarding. No palpable mass. No CVA tenderness.  Musculoskeletal: She exhibits no edema, tenderness or deformity.  Neurological: She is alert and oriented to person, place, and time. No cranial nerve deficit. She exhibits normal muscle tone. Coordination normal.  Skin: Skin is warm and dry.  Psychiatric: She has a normal mood and affect.  Nursing note and vitals reviewed.    ED Treatments / Results  Labs (all labs ordered are listed, but only abnormal results are displayed) Labs Reviewed  COMPREHENSIVE METABOLIC PANEL - Abnormal; Notable for the following:       Result Value   Potassium 3.3 (*)    Glucose, Bld 119 (*)    All other components within normal limits  URINALYSIS, ROUTINE W REFLEX MICROSCOPIC - Abnormal; Notable for the following:    APPearance CLOUDY (*)    Hgb urine dipstick MODERATE (*)    Bacteria, UA RARE (*)    Squamous Epithelial / LPF 0-5 (*)    All other components within normal limits  URINE CULTURE  LIPASE, BLOOD  CBC WITH DIFFERENTIAL/PLATELET    EKG  EKG Interpretation None       Radiology Ct Abdomen Pelvis W Contrast  Result Date: 08/12/2016 CLINICAL DATA:  Right lower quadrant plane extending to the flank since 5 a.m. Unusual bowel movement yesterday. Burning abdomen for 2-3 days. EXAM: CT ABDOMEN AND PELVIS WITH CONTRAST TECHNIQUE: Multidetector CT imaging of the abdomen and pelvis was performed using the standard  protocol following bolus administration of intravenous contrast. CONTRAST:  154mL ISOVUE-300 IOPAMIDOL (ISOVUE-300) INJECTION 61% COMPARISON:  None. FINDINGS: Lower chest:  No acute finding.  Mild atelectasis at the bases. Hepatobiliary: No focal liver abnormality.No evidence of biliary obstruction or stone. Pancreas: Unremarkable. Spleen: Unremarkable. Adrenals/Urinary Tract: Negative adrenals. No hydronephrosis or stone. Tiny presumed cyst in the lower right kidney, but too small for densitometry. 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 Unremarkable bladder. Stomach/Bowel: No obstruction. No appendicitis. Sigmoid diverticulosis. Vascular/Lymphatic: No acute vascular abnormality. No mass or adenopathy. Reproductive:No pathologic findings. Other: No ascites or pneumoperitoneum. Musculoskeletal: No acute abnormalities. Advanced lower lumbar facet arthropathy. IMPRESSION: 1. No acute finding to explain abdominal pain. 2. Sigmoid diverticulosis. Electronically Signed   By: Monte Fantasia M.D.   On: 08/12/2016  09:45    Procedures Procedures (including critical care time)  Medications Ordered in ED Medications  iopamidol (ISOVUE-300) 61 % injection (not administered)  0.9 %  sodium chloride infusion ( Intravenous Stopped 08/12/16 0957)  iopamidol (ISOVUE-300) 61 % injection 30 mL (30 mLs Oral Contrast Given 08/12/16 0822)  iopamidol (ISOVUE-300) 61 % injection 100 mL (100 mLs Intravenous Contrast Given 08/12/16 0930)     Initial Impression / Assessment and Plan / ED Course  I have reviewed the triage vital signs and the nursing notes.  Pertinent labs & imaging results that were available during my care of the patient were reviewed by me and considered in my medical decision making (see chart for details).      Final Clinical Impressions(s) / ED Diagnoses   Final diagnoses:  Kidney stone  Hematuria, unspecified type  Patient's symptoms have been spontaneously abating. CT is  negative for acute findings. Patient does have significant hematuria. Based on her symptoms and hematuria I suspect she passed a kidney stone. Patient is counseled on signs and symptoms which are returned and a follow-up plan.  New Prescriptions New Prescriptions   HYDROCODONE-ACETAMINOPHEN (NORCO/VICODIN) 5-325 MG TABLET    Take 1-2 tablets by mouth every 4 (four) hours as needed for moderate pain or severe pain.   IBUPROFEN (ADVIL,MOTRIN) 600 MG TABLET    Take 1 tablet (600 mg total) by mouth every 6 (six) hours as needed.     Charlesetta Shanks, MD 08/12/16 [phone]

## 2016-08-12 NOTE — Discharge Instructions (Signed)
Your symptoms sound very much like a kidney stone. There is blood in your urine which supports a diagnosis of kidney stone. Follow-up with your doctor and return if your symptoms recur.

## 2016-08-13 LAB — URINE CULTURE

## 2016-08-25 DIAGNOSIS — H00011 Hordeolum externum right upper eyelid: Secondary | ICD-10-CM | POA: Diagnosis not present

## 2016-09-09 DIAGNOSIS — Z6838 Body mass index (BMI) 38.0-38.9, adult: Secondary | ICD-10-CM | POA: Diagnosis not present

## 2016-09-09 DIAGNOSIS — G4700 Insomnia, unspecified: Secondary | ICD-10-CM | POA: Diagnosis not present

## 2016-09-09 DIAGNOSIS — I10 Essential (primary) hypertension: Secondary | ICD-10-CM | POA: Diagnosis not present

## 2016-09-09 DIAGNOSIS — E785 Hyperlipidemia, unspecified: Secondary | ICD-10-CM | POA: Diagnosis not present

## 2016-09-09 DIAGNOSIS — Z780 Asymptomatic menopausal state: Secondary | ICD-10-CM | POA: Diagnosis not present

## 2016-09-09 DIAGNOSIS — E049 Nontoxic goiter, unspecified: Secondary | ICD-10-CM | POA: Diagnosis not present

## 2016-10-02 DIAGNOSIS — Z23 Encounter for immunization: Secondary | ICD-10-CM | POA: Diagnosis not present

## 2016-10-08 DIAGNOSIS — N951 Menopausal and female climacteric states: Secondary | ICD-10-CM | POA: Diagnosis not present

## 2016-10-13 DIAGNOSIS — G479 Sleep disorder, unspecified: Secondary | ICD-10-CM | POA: Diagnosis not present

## 2016-10-13 DIAGNOSIS — R5383 Other fatigue: Secondary | ICD-10-CM | POA: Diagnosis not present

## 2016-10-13 DIAGNOSIS — L680 Hirsutism: Secondary | ICD-10-CM | POA: Diagnosis not present

## 2016-10-13 DIAGNOSIS — N951 Menopausal and female climacteric states: Secondary | ICD-10-CM | POA: Diagnosis not present

## 2016-10-13 DIAGNOSIS — M2550 Pain in unspecified joint: Secondary | ICD-10-CM | POA: Diagnosis not present

## 2016-11-12 DIAGNOSIS — J029 Acute pharyngitis, unspecified: Secondary | ICD-10-CM | POA: Diagnosis not present

## 2017-01-30 DIAGNOSIS — Z79899 Other long term (current) drug therapy: Secondary | ICD-10-CM | POA: Diagnosis not present

## 2017-01-30 DIAGNOSIS — R1012 Left upper quadrant pain: Secondary | ICD-10-CM | POA: Diagnosis not present

## 2017-01-30 DIAGNOSIS — I10 Essential (primary) hypertension: Secondary | ICD-10-CM | POA: Diagnosis not present

## 2017-01-30 DIAGNOSIS — E785 Hyperlipidemia, unspecified: Secondary | ICD-10-CM | POA: Diagnosis not present

## 2017-02-06 DIAGNOSIS — R7301 Impaired fasting glucose: Secondary | ICD-10-CM | POA: Diagnosis not present

## 2017-02-09 DIAGNOSIS — Z23 Encounter for immunization: Secondary | ICD-10-CM | POA: Diagnosis not present

## 2017-02-11 DIAGNOSIS — N951 Menopausal and female climacteric states: Secondary | ICD-10-CM | POA: Diagnosis not present

## 2017-02-11 DIAGNOSIS — L821 Other seborrheic keratosis: Secondary | ICD-10-CM | POA: Diagnosis not present

## 2017-02-11 DIAGNOSIS — D225 Melanocytic nevi of trunk: Secondary | ICD-10-CM | POA: Diagnosis not present

## 2017-02-11 DIAGNOSIS — D2239 Melanocytic nevi of other parts of face: Secondary | ICD-10-CM | POA: Diagnosis not present

## 2017-02-11 DIAGNOSIS — L570 Actinic keratosis: Secondary | ICD-10-CM | POA: Diagnosis not present

## 2017-02-11 DIAGNOSIS — R5383 Other fatigue: Secondary | ICD-10-CM | POA: Diagnosis not present

## 2017-02-11 DIAGNOSIS — D1801 Hemangioma of skin and subcutaneous tissue: Secondary | ICD-10-CM | POA: Diagnosis not present

## 2017-02-16 DIAGNOSIS — R4586 Emotional lability: Secondary | ICD-10-CM | POA: Diagnosis not present

## 2017-02-16 DIAGNOSIS — R5383 Other fatigue: Secondary | ICD-10-CM | POA: Diagnosis not present

## 2017-02-16 DIAGNOSIS — N951 Menopausal and female climacteric states: Secondary | ICD-10-CM | POA: Diagnosis not present

## 2017-02-16 DIAGNOSIS — G479 Sleep disorder, unspecified: Secondary | ICD-10-CM | POA: Diagnosis not present

## 2017-02-23 DIAGNOSIS — Z1231 Encounter for screening mammogram for malignant neoplasm of breast: Secondary | ICD-10-CM | POA: Diagnosis not present

## 2017-02-24 DIAGNOSIS — L509 Urticaria, unspecified: Secondary | ICD-10-CM | POA: Diagnosis not present

## 2017-02-26 DIAGNOSIS — H2513 Age-related nuclear cataract, bilateral: Secondary | ICD-10-CM | POA: Diagnosis not present

## 2017-03-03 DIAGNOSIS — R05 Cough: Secondary | ICD-10-CM | POA: Diagnosis not present

## 2017-03-03 DIAGNOSIS — R52 Pain, unspecified: Secondary | ICD-10-CM | POA: Diagnosis not present

## 2017-03-03 DIAGNOSIS — R5381 Other malaise: Secondary | ICD-10-CM | POA: Diagnosis not present

## 2017-04-13 IMAGING — CT CT ABD-PELV W/ CM
2 of 5 series · 16 of 46 positions shown, 18 images · IV contrast (ISOVUE)
Comparison: None.

CLINICAL DATA: Right lower quadrant plane extending to the flank
since 5 a.m.. Unusual bowel movement yesterday. Burning abdomen for
2-3 days.

EXAM:
CT ABDOMEN AND PELVIS WITH CONTRAST
TECHNIQUE: Multidetector CT imaging of the abdomen and pelvis was performed
using the standard protocol following bolus administration of
intravenous contrast.
CONTRAST:  100mL 1VJ9AV-ELL IOPAMIDOL (1VJ9AV-ELL) INJECTION 61%

[Series 2: abd/pel with · axial · 0.80mm/px · z∈[-477,-72]mm · 13 of 95 slices shown, 15 images]
[im 7/95  soft-tissue]
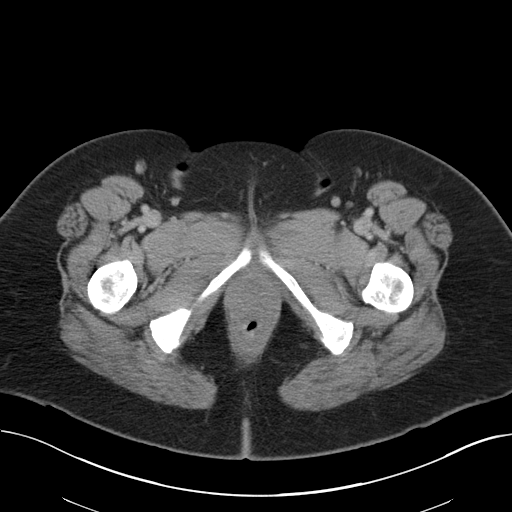
[im 7/95  bone]
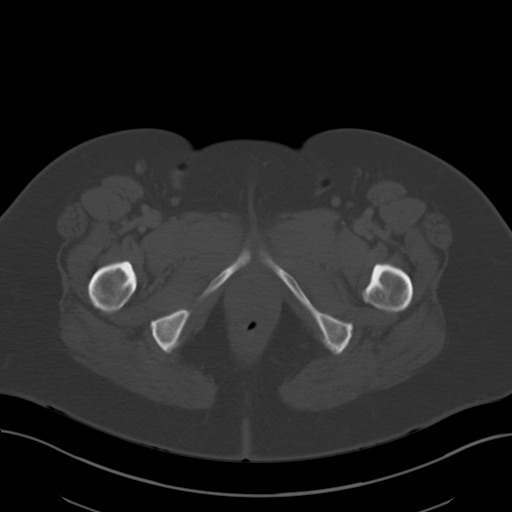
[im 13/95  soft-tissue]
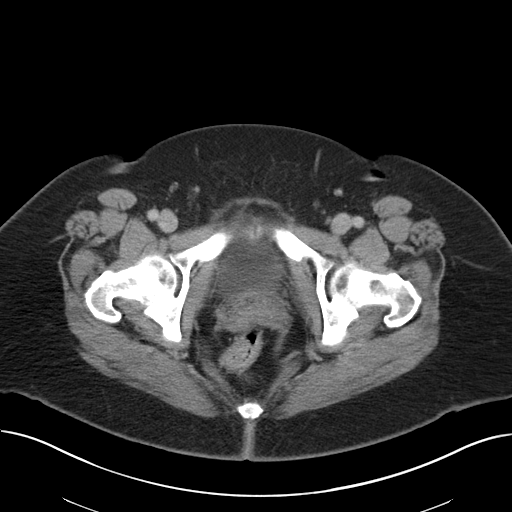
[im 19/95  soft-tissue]
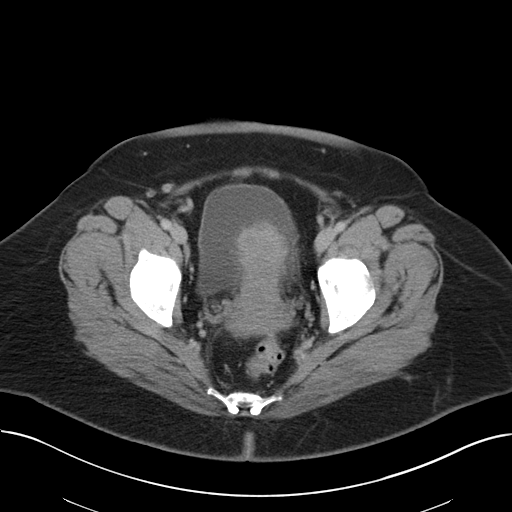
[im 26/95  soft-tissue]
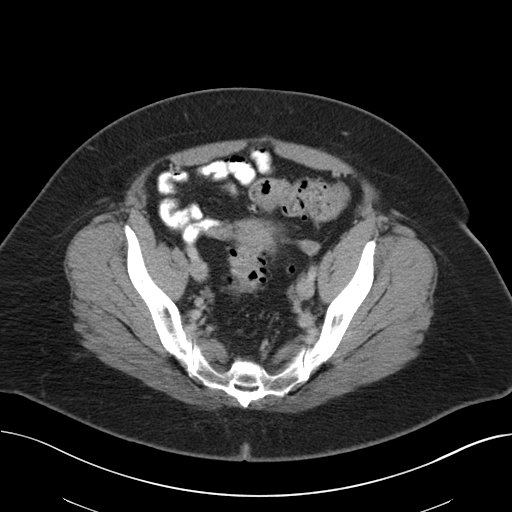
[im 32/95  soft-tissue]
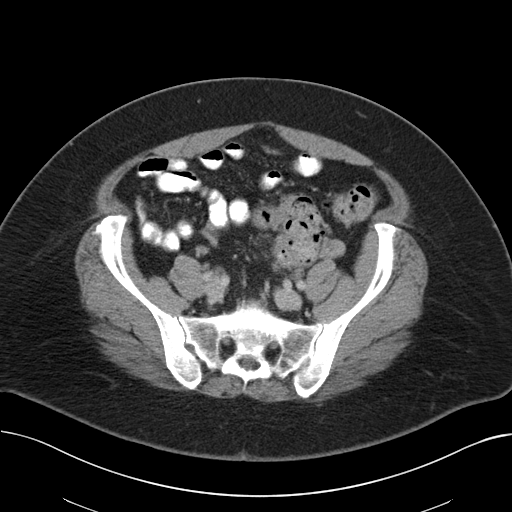
[im 38/95  soft-tissue]
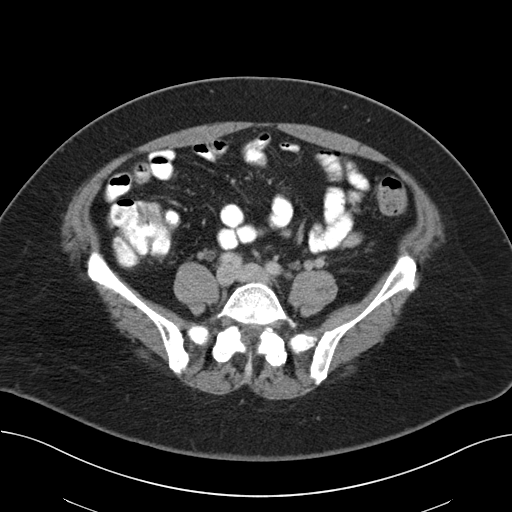
[im 51/95  soft-tissue]
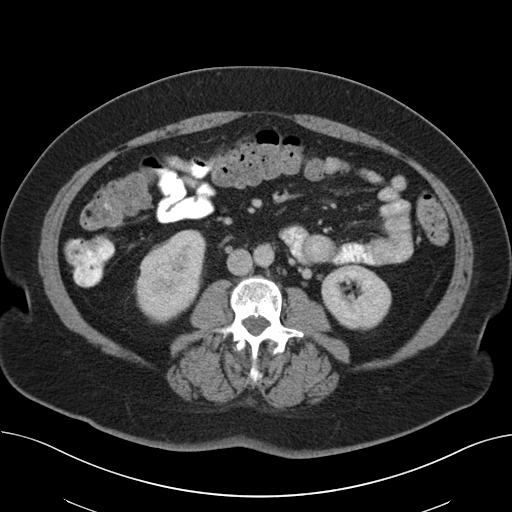
[im 57/95  soft-tissue]
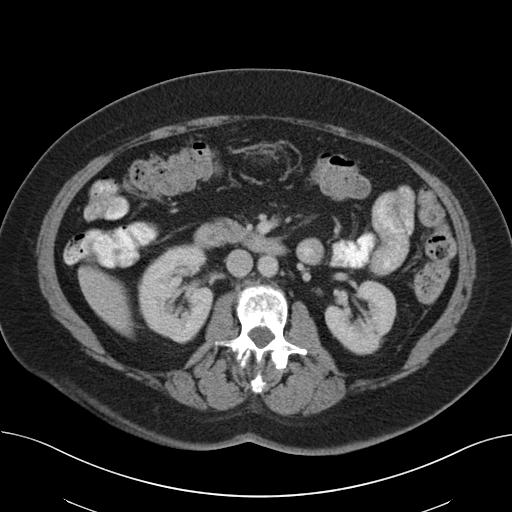
[im 63/95  soft-tissue]
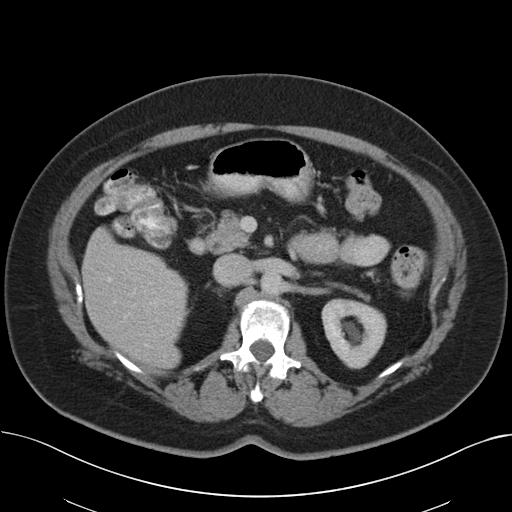
[im 63/95  bone]
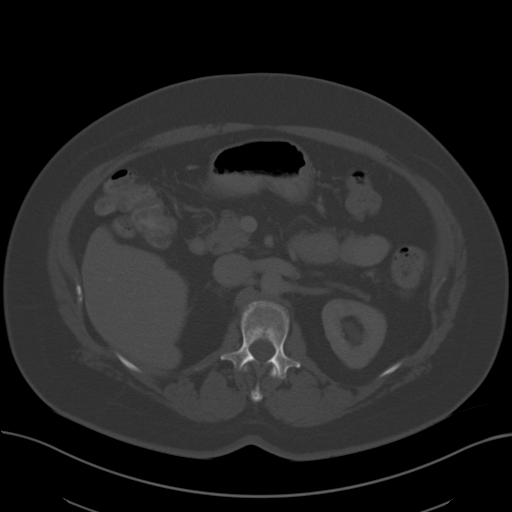
[im 69/95  soft-tissue]
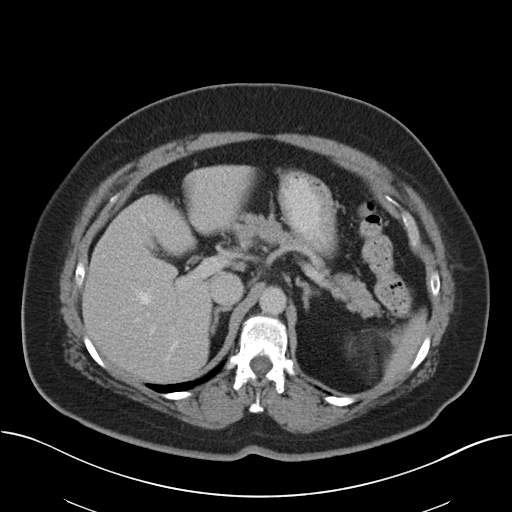
[im 76/95  soft-tissue]
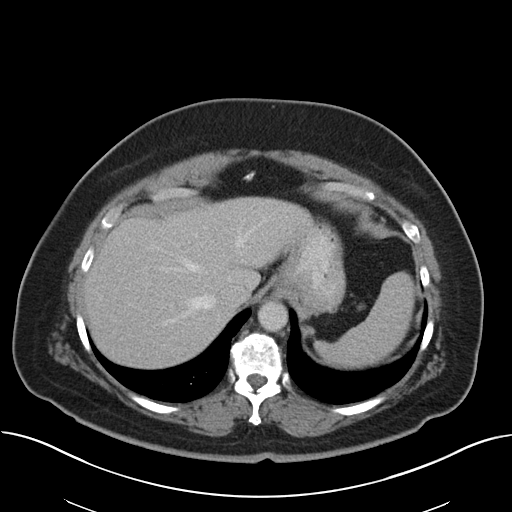
[im 82/95  soft-tissue]
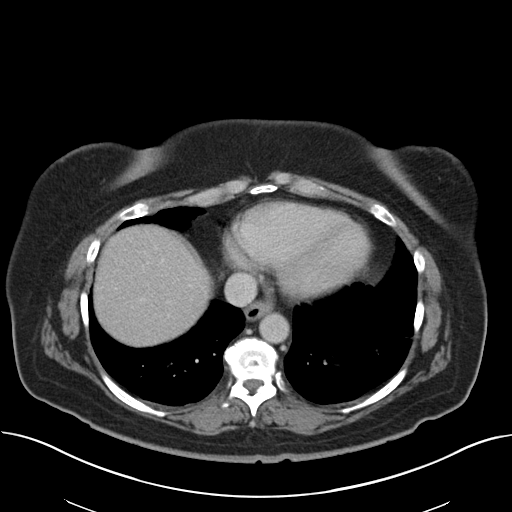
[im 88/95  soft-tissue]
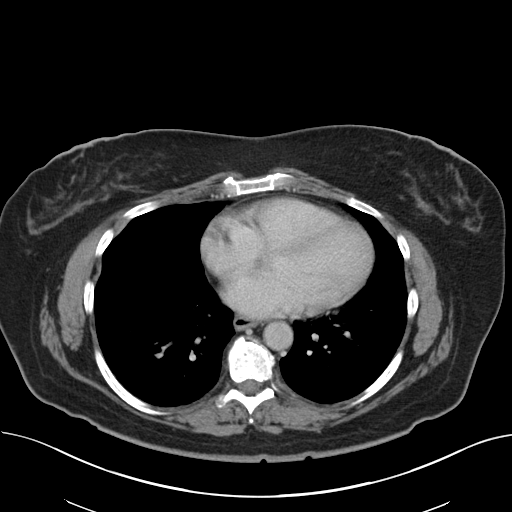

[Series 3: coronal a/|p · coronal · 0.74mm/px · 3 of 153 slices shown]
[im 51/153  soft-tissue]
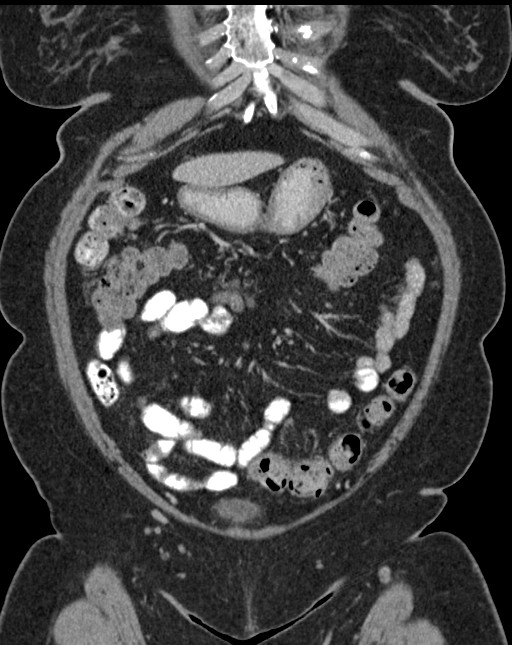
[im 68/153  soft-tissue]
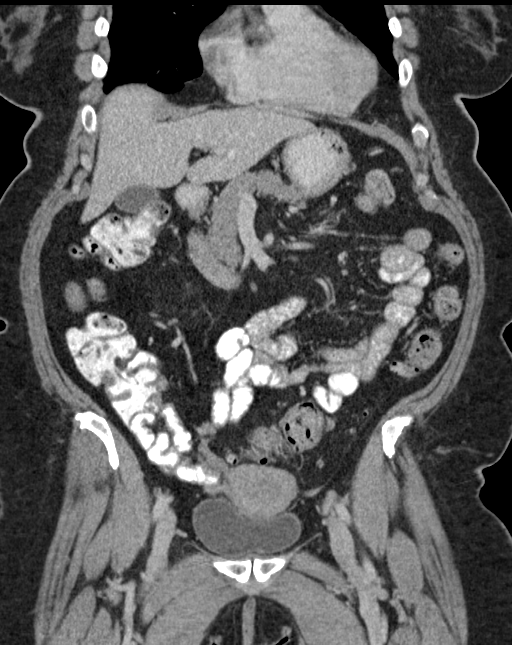
[im 85/153  soft-tissue]
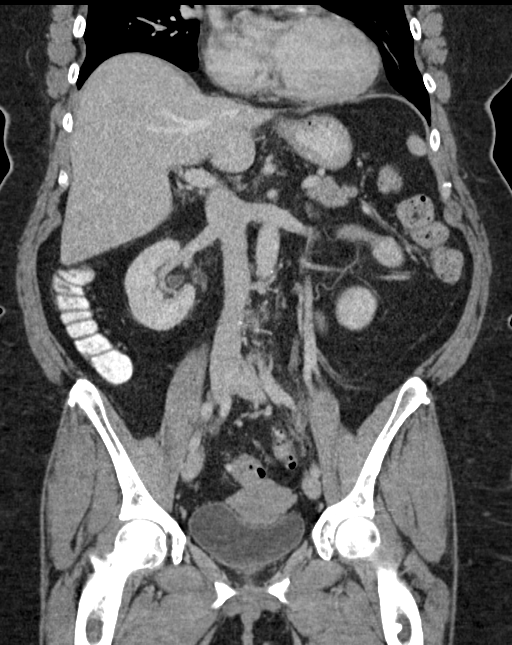

[16 of 46 positions shown; findings below may reference images not displayed]

FINDINGS: Lower chest:  No acute finding.  Mild atelectasis at the bases.

Hepatobiliary: No focal liver abnormality.No evidence of biliary
obstruction or stone.

Pancreas: Unremarkable.

Spleen: Unremarkable.

Adrenals/Urinary Tract: Negative adrenals. No hydronephrosis or
stone. Tiny presumed cyst in the lower right kidney, but too small
for densitometry. A least partially duplicated left urinary
collecting system. Unremarkable bladder.

Stomach/Bowel: No obstruction. No appendicitis. Sigmoid
diverticulosis.

Vascular/Lymphatic: No acute vascular abnormality. No mass or
adenopathy.

Reproductive:No pathologic findings.

Other: No ascites or pneumoperitoneum.

Musculoskeletal: No acute abnormalities. Advanced lower lumbar facet
arthropathy.
IMPRESSION: 1. No acute finding to explain abdominal pain.
2. Sigmoid diverticulosis.

## 2017-06-15 DIAGNOSIS — N951 Menopausal and female climacteric states: Secondary | ICD-10-CM | POA: Diagnosis not present

## 2017-06-15 DIAGNOSIS — G479 Sleep disorder, unspecified: Secondary | ICD-10-CM | POA: Diagnosis not present

## 2017-06-15 DIAGNOSIS — R5383 Other fatigue: Secondary | ICD-10-CM | POA: Diagnosis not present

## 2017-06-19 DIAGNOSIS — R6882 Decreased libido: Secondary | ICD-10-CM | POA: Diagnosis not present

## 2017-06-19 DIAGNOSIS — R5383 Other fatigue: Secondary | ICD-10-CM | POA: Diagnosis not present

## 2017-06-19 DIAGNOSIS — N951 Menopausal and female climacteric states: Secondary | ICD-10-CM | POA: Diagnosis not present

## 2017-06-19 DIAGNOSIS — I10 Essential (primary) hypertension: Secondary | ICD-10-CM | POA: Diagnosis not present

## 2017-06-19 DIAGNOSIS — Z6837 Body mass index (BMI) 37.0-37.9, adult: Secondary | ICD-10-CM | POA: Diagnosis not present

## 2017-06-19 DIAGNOSIS — L680 Hirsutism: Secondary | ICD-10-CM | POA: Diagnosis not present

## 2017-06-19 DIAGNOSIS — M2550 Pain in unspecified joint: Secondary | ICD-10-CM | POA: Diagnosis not present

## 2017-06-19 DIAGNOSIS — G479 Sleep disorder, unspecified: Secondary | ICD-10-CM | POA: Diagnosis not present

## 2017-07-30 DIAGNOSIS — K121 Other forms of stomatitis: Secondary | ICD-10-CM | POA: Diagnosis not present

## 2017-07-30 DIAGNOSIS — I10 Essential (primary) hypertension: Secondary | ICD-10-CM | POA: Diagnosis not present

## 2017-07-30 DIAGNOSIS — B001 Herpesviral vesicular dermatitis: Secondary | ICD-10-CM | POA: Diagnosis not present

## 2017-07-30 DIAGNOSIS — E049 Nontoxic goiter, unspecified: Secondary | ICD-10-CM | POA: Diagnosis not present

## 2017-07-30 DIAGNOSIS — R197 Diarrhea, unspecified: Secondary | ICD-10-CM | POA: Diagnosis not present

## 2017-07-30 DIAGNOSIS — M2550 Pain in unspecified joint: Secondary | ICD-10-CM | POA: Diagnosis not present

## 2017-07-30 DIAGNOSIS — R7301 Impaired fasting glucose: Secondary | ICD-10-CM | POA: Diagnosis not present

## 2017-07-30 DIAGNOSIS — Z79899 Other long term (current) drug therapy: Secondary | ICD-10-CM | POA: Diagnosis not present

## 2017-07-30 DIAGNOSIS — E785 Hyperlipidemia, unspecified: Secondary | ICD-10-CM | POA: Diagnosis not present

## 2017-07-31 ENCOUNTER — Telehealth: Payer: Self-pay | Admitting: Gastroenterology

## 2017-07-31 NOTE — Telephone Encounter (Signed)
Spoke with pt, having intermittent abd pain, not sure when she is due for colon, not able to determine from chart.  Scheduled an appt 09/04/17 at [facility].

## 2017-07-31 NOTE — Telephone Encounter (Signed)
Pt wants to know when she is due for a colon.

## 2017-08-25 DIAGNOSIS — Z23 Encounter for immunization: Secondary | ICD-10-CM | POA: Diagnosis not present

## 2017-08-25 DIAGNOSIS — Z1389 Encounter for screening for other disorder: Secondary | ICD-10-CM | POA: Diagnosis not present

## 2017-08-25 DIAGNOSIS — E2839 Other primary ovarian failure: Secondary | ICD-10-CM | POA: Diagnosis not present

## 2017-08-25 DIAGNOSIS — Z0000 Encounter for general adult medical examination without abnormal findings: Secondary | ICD-10-CM | POA: Diagnosis not present

## 2017-08-25 DIAGNOSIS — Z1331 Encounter for screening for depression: Secondary | ICD-10-CM | POA: Diagnosis not present

## 2017-09-04 ENCOUNTER — Ambulatory Visit (INDEPENDENT_AMBULATORY_CARE_PROVIDER_SITE_OTHER): Payer: Medicare Other | Admitting: Gastroenterology

## 2017-09-04 ENCOUNTER — Encounter: Payer: Self-pay | Admitting: Gastroenterology

## 2017-09-04 ENCOUNTER — Other Ambulatory Visit: Payer: Medicare Other

## 2017-09-04 VITALS — BP 130/78 | HR 78 | Ht 62.5 in | Wt 208.4 lb

## 2017-09-04 DIAGNOSIS — R197 Diarrhea, unspecified: Secondary | ICD-10-CM

## 2017-09-04 DIAGNOSIS — Z8719 Personal history of other diseases of the digestive system: Secondary | ICD-10-CM

## 2017-09-04 MED ORDER — DICYCLOMINE HCL 10 MG PO CAPS: ORAL_CAPSULE | ORAL | 2 refills | Status: AC

## 2017-09-04 NOTE — Patient Instructions (Signed)
If you are age 68 or older, your body mass index should be between 23-30. Your Body mass index is 37.5 kg/m. If this is out of the aforementioned range listed, please consider follow up with your Primary Care Provider.  If you are age 62 or younger, your body mass index should be between 19-25. Your Body mass index is 37.5 kg/m. If this is out of the aformentioned range listed, please consider follow up with your Primary Care Provider.   We have sent the following medications to your pharmacy for you to pick up at your convenience: Bentyl 10 mg twice daily before a meal and bedtime.   Stop Fish Oil x 2 weeks.   Please call Dr. Leland Her nurse Leeanne Rio, RN)  in 2 weeks at [phone]  to let her now how you are doing.   Thank you,  Dr. Jackquline Denmark

## 2017-09-04 NOTE — Progress Notes (Signed)
Chief Complaint: Diarrhea  Referring Provider:  Ernestene Kiel, MD      ASSESSMENT AND PLAN;   #1. Diarrhea, neg CT 08/12/2016, neg colon 09/2011 - stool for GI pathogen, C. Diff - Trial of Bentyl 10mg  po bid,  - Stop fish oil x 2 weks. - Please obtain previous records -labs from Dr. Felipa Emory office. - Patient to call us in 2 weeks. -  If still with problems, and the above work-up is negative, proceed with Rpt colon with Bx. - FU in 12 weeks. - Await record de-conversion. - CT scan reviewed with the patient in detail.  #2. H/O diverticular bleed-colonoscopy 09/2011 showed moderate predominantly sigmoid diverticulosis (done as a part of colorectal cancer screening)   HPI:    67 year old very pleasant female with alternating diarrhea and constipation since Jan 2019.  However, over the last 2 weeks she has had more diarrhea -4 bowel movements per day, loose, watery, with nocturnal symptoms and lower abdominal crampy pain.  No blood.  She has not been on any antibiotics.  She has taken Valtrex for fever blisters.  No nausea, vomiting, heartburn, regurgitation, odynophagia or dysphagia.  There is no melena or hematochezia. No unintentional weight loss.   Had blood work done including TSH which was normal per patient.  Had negative CT scan of the abdomen and pelvis on 08/12/2016.   Past Medical History:  Diagnosis Date  . Arthritis   . Asthma   . Constipation   . Diverticulitis 05/2007   hospitalized  . DJD (degenerative joint disease)   . Graves disease   . Hyperglycemia   . Hyperlipemia   . Hypertension   . Kidney stones   . Obesity   . UTI (urinary tract infection)     Past Surgical History:  Procedure Laterality Date  . CESAREAN SECTION     x2  . COLONOSCOPY  09/24/2011   moderate sigmoid diverticulosis, small internal hemorrhoids. no polyps  . KNEE ARTHROSCOPY Left    torn menisicus  . 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 WRIST SURGERY Left    gangilon cyst    History reviewed. No pertinent family history.  Social History   Tobacco Use  . Smoking status: Never Smoker  . Smokeless tobacco: Never Used  Substance Use Topics  . Alcohol use: Yes    Alcohol/week: 1.2 oz    Types: 2 Glasses of wine per week  . Drug use: No    Current Outpatient Medications  Medication Sig Dispense Refill  . aspirin EC 81 MG tablet Take 81 mg by mouth at bedtime.    . Cholecalciferol (D3-1000) 1000 UNITS capsule Take 1,000 Units by mouth daily.    . Fish Oil-Cholecalciferol (FISH OIL + D3 PO) Take 1 capsule by mouth daily.    Marland Kitchen ibuprofen (ADVIL,MOTRIN) 600 MG tablet Take 1 tablet (600 mg total) by mouth every 6 (six) hours as needed. 30 tablet 0  . lisinopril-hydrochlorothiazide (PRINZIDE,ZESTORETIC) 10-12.5 MG per tablet Take 1 tablet by mouth daily with breakfast.     . NONFORMULARY OR COMPOUNDED ITEM Progesterone 200mg  - 1 capsule by mouth at night time (Hormone Replacement)    . pravastatin (PRAVACHOL) 10 MG tablet Take 10 mg by mouth at bedtime.     Marland Kitchen PRESCRIPTION MEDICATION every 3 (three) months. Testosterone Pellet (Hormone Replacement) inserted at dr's office  No current facility-administered medications for this visit.     No Known Allergies  Review of Systems:  Constitutional: Denies fever, chills, diaphoresis, appetite change, has fatigue.  HEENT: Denies photophobia, eye pain, redness, hearing loss, ear pain, congestion, sore throat, rhinorrhea, sneezing, mouth sores, neck pain, neck stiffness and tinnitus.   Respiratory: Denies SOB, DOE, cough, chest tightness,  and wheezing.   Cardiovascular: Denies chest pain, palpitations and leg swelling.  Genitourinary: Denies dysuria, urgency, frequency, hematuria, flank pain and difficulty urinating.  Musculoskeletal: Denies myalgias, back pain, joint  swelling, arthralgias and gait problem.  Skin: No rash.  Neurological: Denies dizziness, seizures, syncope, weakness, light-headedness, numbness and headaches.  Hematological: Denies adenopathy. Easy bruising, personal or family bleeding history  Psychiatric/Behavioral: No anxiety or depression     Physical Exam:    BP 130/78   Pulse 78   Ht 5' 2.5" (1.588 m)   Wt 208 lb 6 oz (94.5 kg)   BMI 37.50 kg/m  Filed Weights   09/04/17 0944  Weight: 208 lb 6 oz (94.5 kg)   Constitutional:  Well-developed, in no acute distress. Psychiatric: Normal mood and affect. Behavior is normal. HEENT: Pupils normal.  Conjunctivae are normal. No scleral icterus. Neck supple.  Cardiovascular: Normal rate, regular rhythm. No edema Pulmonary/chest: Effort normal and breath sounds normal. No wheezing, rales or rhonchi. Abdominal: Soft, nondistended. Nontender. Bowel sounds active throughout. There are no masses palpable. No hepatomegaly. Rectal:  defered Neurological: Alert and oriented to person place and time. Skin: Skin is warm and dry. No rashes noted.  Data Reviewed: I have personally reviewed following labs and imaging studies  CBC: CBC Latest Ref Rng & Units 08/12/2016 05/10/2013 05/09/2013  WBC 4.0 - 10.5 K/uL 9.5 12.0(H) 15.9(H)  Hemoglobin 12.0 - 15.0 g/dL 14.6 11.9(L) 12.5  Hematocrit 36.0 - 46.0 % 41.5 35.4(L) 36.3  Platelets 150 - 400 K/uL 252 234 247    CMP: CMP Latest Ref Rng & Units 08/12/2016 05/10/2013 05/09/2013  Glucose 65 - 99 mg/dL 119(H) 95 -  BUN 6 - 20 mg/dL 16 10 -  Creatinine 0.44 - 1.00 mg/dL 0.89 0.61 0.66  Sodium 135 - 145 mmol/L 139 137 -  Potassium 3.5 - 5.1 mmol/L 3.3(L) 3.8 -  Chloride 101 - 111 mmol/L 108 99 -  CO2 22 - 32 mmol/L 24 27 -  Calcium 8.9 - 10.3 mg/dL 9.4 8.3(L) -  Total Protein 6.5 - 8.1 g/dL 7.4 - -  Total Bilirubin 0.3 - 1.2 mg/dL 0.8 - -  Alkaline Phos 38 - 126 U/L 67 - -  AST 15 - 41 U/L 24 - -  ALT 14 - 54 U/L 17 - -      Radiology  Studies: CT abdomen and pelvis 08/12/2016-reviewed independently and reviewed with the patient.  It did show sigmoid diverticular disease, no acute abnormalities.  Incidental duplicated left renal collecting system.   Carmell Austria, MD   Cc: Ernestene Kiel, MD

## 2017-09-08 LAB — GASTROINTESTINAL PATHOGEN PANEL PCR
C. difficile Tox A/B, PCR: NOT DETECTED
CRYPTOSPORIDIUM, PCR: NOT DETECTED
Campylobacter, PCR: NOT DETECTED
E COLI (ETEC) LT/ST, PCR: NOT DETECTED
E COLI (STEC) STX1/STX2, PCR: NOT DETECTED
E COLI 0157, PCR: NOT DETECTED
Giardia lamblia, PCR: NOT DETECTED
Norovirus, PCR: NOT DETECTED
Rotavirus A, PCR: NOT DETECTED
Salmonella, PCR: NOT DETECTED
Shigella, PCR: NOT DETECTED

## 2017-09-08 LAB — FECAL LACTOFERRIN, QUANT
Fecal Lactoferrin: NEGATIVE
MICRO NUMBER: 90543070
SPECIMEN QUALITY: ADEQUATE

## 2017-09-08 LAB — CLOSTRIDIUM DIFFICILE TOXIN B, QUALITATIVE, REAL-TIME PCR: Toxigenic C. Difficile by PCR: NOT DETECTED

## 2017-09-09 ENCOUNTER — Telehealth: Payer: Self-pay | Admitting: Gastroenterology

## 2017-09-09 NOTE — Telephone Encounter (Signed)
Proceed with colonoscopy with random biopsies with MiraLAX preparation. I believe it could be done in [place].  Please make sure that she is not taking any blood thinners

## 2017-09-09 NOTE — Telephone Encounter (Signed)
I spoke with her this morning and let her know that all of her stool tests were negative.  She has stopped the fish oil and is taking the Bentyl but reports that the diarrhea has not improved.  Your office note indicates that next step would be colonoscopy with bx.  Do you want me to schedule that?  Please advise.  Thank you.

## 2017-09-10 ENCOUNTER — Telehealth: Payer: Self-pay

## 2017-09-10 NOTE — Telephone Encounter (Signed)
lmtcb

## 2017-09-10 NOTE — Telephone Encounter (Signed)
I spoke with her and she would like to proceed with the colonoscopy.  Scheduled for 10/01/17 at 8:00 am with pre-o on 09/24/17 at 3:30 pm.  This will be a diagnostic colonoscopy secondary to being symptomatic.  She is going to contact her insurance company to verify coverage.

## 2017-09-24 ENCOUNTER — Other Ambulatory Visit: Payer: Self-pay

## 2017-09-24 ENCOUNTER — Ambulatory Visit (AMBULATORY_SURGERY_CENTER): Payer: Self-pay

## 2017-09-24 VITALS — Ht 62.5 in | Wt 211.0 lb

## 2017-09-24 DIAGNOSIS — R197 Diarrhea, unspecified: Secondary | ICD-10-CM

## 2017-09-24 NOTE — Progress Notes (Signed)
No egg or soy allergy known to patient  No issues with past sedation with any surgeries  or procedures, no intubation problems  No diet pills per patient No home 02 use per patient  No blood thinners per patient  Pt denies issues with constipation  No A fib or A flutter  EMMI video sent to pt's e mail. Sent video

## 2017-10-01 ENCOUNTER — Other Ambulatory Visit: Payer: Self-pay

## 2017-10-01 ENCOUNTER — Encounter: Payer: Self-pay | Admitting: Gastroenterology

## 2017-10-01 ENCOUNTER — Ambulatory Visit (AMBULATORY_SURGERY_CENTER): Payer: Medicare Other | Admitting: Gastroenterology

## 2017-10-01 VITALS — BP 130/63 | HR 75 | Temp 97.8°F | Resp 16 | Ht 62.5 in | Wt 211.0 lb

## 2017-10-01 DIAGNOSIS — I10 Essential (primary) hypertension: Secondary | ICD-10-CM | POA: Diagnosis not present

## 2017-10-01 DIAGNOSIS — R197 Diarrhea, unspecified: Secondary | ICD-10-CM | POA: Diagnosis not present

## 2017-10-01 DIAGNOSIS — K529 Noninfective gastroenteritis and colitis, unspecified: Secondary | ICD-10-CM | POA: Diagnosis not present

## 2017-10-01 MED ORDER — SODIUM CHLORIDE 0.9 % IV SOLN: 500.0000 mL | Freq: Once | INTRAVENOUS | Status: AC

## 2017-10-01 NOTE — Progress Notes (Signed)
Called to room to assist during endoscopic procedure.  Patient ID and intended procedure confirmed with present staff. Received instructions for my participation in the procedure from the performing physician.  

## 2017-10-01 NOTE — Progress Notes (Signed)
Report to PACU, RN, vss, BBS= Clear.  

## 2017-10-01 NOTE — Op Note (Signed)
[facility] Patient Name: Amy Mcbride Procedure Date: 10/01/2017 8:05 AM MRN: [id number] Endoscopist: Jackquline Denmark , MD Age: 67 Referring MD:  Date of Birth: 13-Sep-1950 Gender: Female Account #: [id number] Procedure:                Colonoscopy Indications:              Clinically significant diarrhea of unexplained                            origin Medicines:                Monitored Anesthesia Care Procedure:                Pre-Anesthesia Assessment:                           - Prior to the procedure, a History and Physical                            was performed, and patient medications and                            allergies were reviewed. The patient is competent.                            The risks and benefits of the procedure and the                            sedation options and risks were discussed with the                            patient. All questions were answered and informed                            consent was obtained. Patient identification and                            proposed procedure were verified by the physician                            in the procedure room. Mental Status Examination:                            alert and oriented. Prophylactic Antibiotics: The                            patient does not require prophylactic antibiotics.                            Prior Anticoagulants: The patient has taken no                            previous anticoagulant or antiplatelet agents. ASA  Grade Assessment: II - A patient with mild systemic                            disease. After reviewing the risks and benefits,                            the patient was deemed in satisfactory condition to                            undergo the procedure. The anesthesia plan was to                            use monitored anesthesia care (MAC). Immediately                            prior to administration of medications, the  patient                            was re-assessed for adequacy to receive sedatives.                            The heart rate, respiratory rate, oxygen                            saturations, blood pressure, adequacy of pulmonary                            ventilation, and response to care were monitored                            throughout the procedure. The physical status of                            the patient was re-assessed after the procedure.                           After obtaining informed consent, the colonoscope                            was passed under direct vision. Throughout the                            procedure, the patient's blood pressure, pulse, and                            oxygen saturations were monitored continuously. The                            Colonoscope was introduced through the anus and                            advanced to the 3 cm into the ileum. The  colonoscopy was performed without difficulty. The                            patient tolerated the procedure well. The quality                            of the bowel preparation was good. Scope In: 8:09:56 AM Scope Out: 8:30:56 AM Scope Withdrawal Time: 0 hours 13 minutes 45 seconds  Total Procedure Duration: 0 hours 21 minutes 0 seconds  Findings:                 Patchy mild inflammation characterized by erythema                            was found in the rectum and sigmoid colon upto 40                            cm. Biopsies were taken with a cold forceps for                            histology. Biopsies were taken with a cold forceps                            for histology from normal-appearing right colon and                            normal-appearing TI.                           Multiple small-mouthed diverticula were found in                            the sigmoid colon and few in the descending colon.                           Non-bleeding internal  hemorrhoids were found during                            retroflexion. The hemorrhoids were small.                           The exam was otherwise without abnormality on                            direct and retroflexion views. Complications:            No immediate complications. Estimated Blood Loss:     Estimated blood loss: none. Impression:               - Mild colitis involving the rectum and the sigmoid                            colon-likely resolving acute colitis. Biopsied.                            (  Doubt IBD)                           - Diverticulosis in the sigmoid colon and in the                            descending colon.                           - Non-bleeding internal hemorrhoids.                           - The examination was otherwise normal on direct                            and retroflexion views. Recommendation:           - Patient has a contact number available for                            emergencies. The signs and symptoms of potential                            delayed complications were discussed with the                            patient. Return to normal activities tomorrow.                            Written discharge instructions were provided to the                            patient.                           - Resume previous diet.                           - Continue present medications.                           - Await pathology results.                           - Return to GI office in 8 weeks. Jackquline Denmark, MD 10/01/2017 8:40:53 AM This report has been signed electronically.

## 2017-10-01 NOTE — Patient Instructions (Signed)
Mild colitits sigmoid colon.   YOU HAD AN ENDOSCOPIC PROCEDURE TODAY AT [facility]:   Refer to the procedure report that was given to you for any specific questions about what was found during the examination.  If the procedure report does not answer your questions, please call your gastroenterologist to clarify.  If you requested that your care partner not be given the details of your procedure findings, then the procedure report has been included in a sealed envelope for you to review at your convenience later.  YOU SHOULD EXPECT: Some feelings of bloating in the abdomen. Passage of more gas than usual.  Walking can help get rid of the air that was put into your GI tract during the procedure and reduce the bloating. If you had a lower endoscopy (such as a colonoscopy or flexible sigmoidoscopy) you may notice spotting of blood in your stool or on the toilet paper. If you underwent a bowel prep for your procedure, you may not have a normal bowel movement for a few days.  Please Note:  You might notice some irritation and congestion in your nose or some drainage.  This is from the oxygen used during your procedure.  There is no need for concern and it should clear up in a day or so.  SYMPTOMS TO REPORT IMMEDIATELY:   Following lower endoscopy (colonoscopy or flexible sigmoidoscopy):  Excessive amounts of blood in the stool  Significant tenderness or worsening of abdominal pains  Swelling of the abdomen that is new, acute  Fever of 100F or higher    For urgent or emergent issues, a gastroenterologist can be reached at any hour by calling [phone].   DIET:  We do recommend a small meal at first, but then you may proceed to your regular diet.  Drink plenty of fluids but you should avoid alcoholic beverages for 24 hours.  ACTIVITY:  You should plan to take it easy for the rest of today and you should NOT DRIVE or use heavy machinery until tomorrow (because of the sedation  medicines used during the test).    FOLLOW UP: Our staff will call the number listed on your records the next business day following your procedure to check on you and address any questions or concerns that you may have regarding the information given to you following your procedure. If we do not reach you, we will leave a message.  However, if you are feeling well and you are not experiencing any problems, there is no need to return our call.  We will assume that you have returned to your regular daily activities without incident.  If any biopsies were taken you will be contacted by phone or by letter within the next 1-3 weeks.  Please call us at [phone] if you have not heard about the biopsies in 3 weeks.    SIGNATURES/CONFIDENTIALITY: You and/or your care partner have signed paperwork which will be entered into your electronic medical record.  These signatures attest to the fact that that the information above on your After Visit Summary has been reviewed and is understood.  Full responsibility of the confidentiality of this discharge information lies with you and/or your care-partner.

## 2017-10-02 ENCOUNTER — Telehealth: Payer: Self-pay | Admitting: *Deleted

## 2017-10-02 NOTE — Telephone Encounter (Signed)
  Follow up Call-  Call back number 10/01/2017  Post procedure Call Back phone  # [phone]  Permission to leave phone message Yes  Some recent data might be hidden     Patient questions:  Do you have a fever, pain , or abdominal swelling? No. Pain Score  0 *  Have you tolerated food without any problems? Yes.    Have you been able to return to your normal activities? Yes.    Do you have any questions about your discharge instructions: Diet   No. Medications  No. Follow up visit  No.  Do you have questions or concerns about your Care? No.  Actions: * If pain score is 4 or above: No action needed, pain <4.

## 2017-10-08 ENCOUNTER — Telehealth: Payer: Self-pay | Admitting: Gastroenterology

## 2017-10-08 NOTE — Telephone Encounter (Signed)
Have discussed the pathology report with the patient in detail.  Pathology showed lymphocytic colitis. She has gotten significantly better ever since her colonoscopy. I have discussed regarding steroids including budesonide, Pepto-Bismol or mesalamine.  She would like to hold off on any of these medicines at the present time as she feels significantly better She has a follow-up appointment. She will call us if she starts having problems again.

## 2017-10-15 DIAGNOSIS — R6882 Decreased libido: Secondary | ICD-10-CM | POA: Diagnosis not present

## 2017-10-15 DIAGNOSIS — G479 Sleep disorder, unspecified: Secondary | ICD-10-CM | POA: Diagnosis not present

## 2017-10-15 DIAGNOSIS — N951 Menopausal and female climacteric states: Secondary | ICD-10-CM | POA: Diagnosis not present

## 2017-10-23 DIAGNOSIS — Z13820 Encounter for screening for osteoporosis: Secondary | ICD-10-CM | POA: Diagnosis not present

## 2017-10-23 DIAGNOSIS — E2839 Other primary ovarian failure: Secondary | ICD-10-CM | POA: Diagnosis not present

## 2017-10-28 DIAGNOSIS — L680 Hirsutism: Secondary | ICD-10-CM | POA: Diagnosis not present

## 2017-10-28 DIAGNOSIS — R4586 Emotional lability: Secondary | ICD-10-CM | POA: Diagnosis not present

## 2017-10-28 DIAGNOSIS — N951 Menopausal and female climacteric states: Secondary | ICD-10-CM | POA: Diagnosis not present

## 2017-10-28 DIAGNOSIS — M2550 Pain in unspecified joint: Secondary | ICD-10-CM | POA: Diagnosis not present

## 2017-10-28 DIAGNOSIS — G479 Sleep disorder, unspecified: Secondary | ICD-10-CM | POA: Diagnosis not present

## 2017-11-10 ENCOUNTER — Ambulatory Visit (INDEPENDENT_AMBULATORY_CARE_PROVIDER_SITE_OTHER): Payer: Medicare Other | Admitting: Gastroenterology

## 2017-11-10 ENCOUNTER — Encounter: Payer: Self-pay | Admitting: Gastroenterology

## 2017-11-10 VITALS — BP 122/74 | HR 74 | Ht 62.5 in | Wt 211.0 lb

## 2017-11-10 DIAGNOSIS — K52832 Lymphocytic colitis: Secondary | ICD-10-CM

## 2017-11-10 DIAGNOSIS — K589 Irritable bowel syndrome without diarrhea: Secondary | ICD-10-CM | POA: Diagnosis not present

## 2017-11-10 MED ORDER — MESALAMINE ER 0.375 G PO CP24: 375.0000 mg | ORAL_CAPSULE | Freq: Four times a day (QID) | ORAL | 5 refills | Status: AC

## 2017-11-10 MED ORDER — MESALAMINE ER 0.375 G PO CP24: 375.0000 mg | ORAL_CAPSULE | Freq: Four times a day (QID) | ORAL | 0 refills | Status: AC

## 2017-11-10 NOTE — Patient Instructions (Addendum)
If you are age 68 or older, your body mass index should be between 23-30. Your Body mass index is 37.98 kg/m. If this is out of the aforementioned range listed, please consider follow up with your Primary Care Provider.  If you are age 48 or younger, your body mass index should be between 19-25. Your Body mass index is 37.98 kg/m. If this is out of the aformentioned range listed, please consider follow up with your Primary Care Provider.   We have sent the following medications to your pharmacy for you to pick up at your convenience: Apriso 0.375mcg  Please purchase the following medications over the counter and take as directed: Metamucil Take 1 tablespoon mixed with water once a day.  Thank you,  Dr. Jackquline Denmark

## 2017-11-10 NOTE — Progress Notes (Signed)
IMPRESSION and PLAN:   #1. 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 Neg stool studies.  -Avoid nonsteroidals.   -Continue Bentyl 10mg  po bid as needed. -Since she does get constipated at times, start Metamucil 1 tablespoon p.o. every morning with 8 ounces of water.  She will decrease water intake if she is having episodes of diarrhea. -She does not want to take any medicines like mesalamine on a regular basis.  We have given her samples of Apriso to be taken 4/day if she starts having diarrhea.  She will also get in touch with [place].  If she still has problems, will give her a trial of budesonide. - Copy of the CT scan was given to the patient. - Follow-up in 6 months, earlier in case of any problems.  #2. H/O diverticular bleed-colonoscopy 09/2011 showed moderate predominantly sigmoid diverticulosis         HPI:    Chief Complaint:   Amy Mcbride is a 67 y.o. female  For follow-up. Underwent colonoscopy 10/01/2017 which showed mild resolving colitis.  The biopsies came back as lymphocytic colitis.  Terminal ileum was normal. She has not been using any nonsteroidals. Felt significantly better after colonoscopy except for 2 episodes of diarrhea.  She could not exactly identify any triggering factors. Stool studies were all negative previously including negative for WBCs. She does not want to take mesalamine or any other medications on a regular basis. Overall feels significantly better. Her celiac screen previously was negative. Lately she has been more constipated except for 2 episodes.   Past Medical History:  Diagnosis Date  . Arthritis   . Constipation   . Diverticulitis 05/2007   hospitalized  . DJD (degenerative joint disease)   . GERD (gastroesophageal reflux disease)   . Graves disease   . Hyperglycemia   . Hyperlipemia   . Hypertension   . Kidney stones   . Obesity   . UTI (urinary tract  infection)     Current Outpatient Medications  Medication Sig Dispense Refill  . aspirin EC 81 MG tablet Take 81 mg by mouth at bedtime.    . Cholecalciferol (D3-1000) 1000 UNITS capsule Take 1,000 Units by mouth daily.    Marland Kitchen Co-Enzyme Q-10 30 MG CAPS Take 100 mg by mouth daily.     Marland Kitchen dicyclomine (BENTYL) 10 MG capsule Twice daily 1/2 hour before meal and bedtime. 60 capsule 2  . Fish Oil-Cholecalciferol (FISH OIL + D3 PO) Take 1 capsule by mouth daily.    Marland Kitchen ibuprofen (ADVIL,MOTRIN) 600 MG tablet Take 1 tablet (600 mg total) by mouth every 6 (six) hours as needed. 30 tablet 0  . lisinopril-hydrochlorothiazide (PRINZIDE,ZESTORETIC) 10-12.5 MG per tablet Take 1 tablet by mouth daily with breakfast.     . NONFORMULARY OR COMPOUNDED ITEM Progesterone 200mg  - 1 capsule by mouth at night time (Hormone Replacement)    . pravastatin (PRAVACHOL) 10 MG tablet Take 10 mg by mouth at bedtime.     Marland Kitchen PRESCRIPTION MEDICATION every 3 (three) months. Testosterone Pellet (Hormone Replacement) inserted at dr's office    . valACYclovir (VALTREX) 1000 MG tablet TAKE 2 TABLETS AT FIRST SIGNS OF FEVER BLISTER THEN REPEAT 2 TABLETS IN 12 HOURS ORALLY  4   Current Facility-Administered Medications  Medication Dose Route Frequency Provider Last Rate Last Dose  . 0.9 %  sodium chloride infusion  500 mL Intravenous Once Jackquline Denmark, MD  Past Surgical History:  Procedure Laterality Date  . CESAREAN SECTION     x2  . COLONOSCOPY  09/24/2011   moderate sigmoid diverticulosis, small internal hemorrhoids. no polyps  . KNEE ARTHROSCOPY Left    torn menisicus  . 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 WRIST SURGERY Left    gangilon cyst    Family History  Problem Relation Age of Onset  . Colon cancer Neg Hx   . Colon polyps Neg Hx   . Esophageal cancer Neg Hx   . Rectal cancer  Neg Hx   . Stomach cancer Neg Hx     Social History   Tobacco Use  . Smoking status: Never Smoker  . Smokeless tobacco: Never Used  Substance Use Topics  . Alcohol use: Yes    Alcohol/week: 1.2 oz    Types: 2 Glasses of wine per week  . Drug use: No    No Known Allergies   Review of Systems: All systems reviewed and negative except where noted in HPI.    Physical Exam:     BP 122/74   Pulse 74   Ht 5' 2.5" (1.588 m)   Wt 211 lb (95.7 kg)   BMI 37.98 kg/m  @WEIGHTLAST3 @ GENERAL:  Alert, oriented, cooperative, not in acute distress. PSYCH: :Pleasant, normal mood and affect. HEENT:  conjunctiva pink, mucous membranes moist, neck supple without masses. No jaundice. CARDIAC:  S1 S2 normal. No murmers. PULM: Normal respiratory effort, lungs CTA bilaterally, no wheezing. ABDOMEN: Inspection: No visible peristalsis, no abnormal pulsations, skin normal.  Palpation/percussion: Soft, nontender, nondistended, no rigidity, no abnormal dullness to percussion, no hepatosplenomegaly and no palpable abdominal masses.  Auscultation: Normal bowel sounds, no abdominal bruits. Rectal exam: Deferred SKIN:  turgor, no lesions seen. Musculoskeletal:  Normal muscle tone, normal strength. NEURO: Alert and oriented x 3, no focal neurologic deficits.  Seen in presence of Amy Mcbride CMA.     [name],MD 11/10/2017, 9:02 AM   CC Ernestene Kiel, MD

## 2017-12-03 ENCOUNTER — Other Ambulatory Visit: Payer: Self-pay

## 2018-02-16 DIAGNOSIS — R5383 Other fatigue: Secondary | ICD-10-CM | POA: Diagnosis not present

## 2018-02-16 DIAGNOSIS — N951 Menopausal and female climacteric states: Secondary | ICD-10-CM | POA: Diagnosis not present

## 2018-02-16 DIAGNOSIS — G479 Sleep disorder, unspecified: Secondary | ICD-10-CM | POA: Diagnosis not present

## 2018-02-17 DIAGNOSIS — Z23 Encounter for immunization: Secondary | ICD-10-CM | POA: Diagnosis not present

## 2018-02-22 DIAGNOSIS — G479 Sleep disorder, unspecified: Secondary | ICD-10-CM | POA: Diagnosis not present

## 2018-02-22 DIAGNOSIS — N951 Menopausal and female climacteric states: Secondary | ICD-10-CM | POA: Diagnosis not present

## 2018-02-22 DIAGNOSIS — R6882 Decreased libido: Secondary | ICD-10-CM | POA: Diagnosis not present

## 2018-02-22 DIAGNOSIS — M2550 Pain in unspecified joint: Secondary | ICD-10-CM | POA: Diagnosis not present

## 2018-02-24 DIAGNOSIS — R7301 Impaired fasting glucose: Secondary | ICD-10-CM | POA: Diagnosis not present

## 2018-02-24 DIAGNOSIS — Z79899 Other long term (current) drug therapy: Secondary | ICD-10-CM | POA: Diagnosis not present

## 2018-02-24 DIAGNOSIS — Z6838 Body mass index (BMI) 38.0-38.9, adult: Secondary | ICD-10-CM | POA: Diagnosis not present

## 2018-02-24 DIAGNOSIS — E049 Nontoxic goiter, unspecified: Secondary | ICD-10-CM | POA: Diagnosis not present

## 2018-02-24 DIAGNOSIS — G4700 Insomnia, unspecified: Secondary | ICD-10-CM | POA: Diagnosis not present

## 2018-02-24 DIAGNOSIS — E785 Hyperlipidemia, unspecified: Secondary | ICD-10-CM | POA: Diagnosis not present

## 2018-02-24 DIAGNOSIS — E669 Obesity, unspecified: Secondary | ICD-10-CM | POA: Diagnosis not present

## 2018-02-24 DIAGNOSIS — I10 Essential (primary) hypertension: Secondary | ICD-10-CM | POA: Diagnosis not present

## 2018-03-03 DIAGNOSIS — H2513 Age-related nuclear cataract, bilateral: Secondary | ICD-10-CM | POA: Diagnosis not present

## 2018-03-03 DIAGNOSIS — H52223 Regular astigmatism, bilateral: Secondary | ICD-10-CM | POA: Diagnosis not present

## 2018-03-11 DIAGNOSIS — Z1231 Encounter for screening mammogram for malignant neoplasm of breast: Secondary | ICD-10-CM | POA: Diagnosis not present

## 2018-03-18 DIAGNOSIS — L821 Other seborrheic keratosis: Secondary | ICD-10-CM | POA: Diagnosis not present

## 2018-03-18 DIAGNOSIS — D2239 Melanocytic nevi of other parts of face: Secondary | ICD-10-CM | POA: Diagnosis not present

## 2018-03-18 DIAGNOSIS — D225 Melanocytic nevi of trunk: Secondary | ICD-10-CM | POA: Diagnosis not present

## 2018-03-18 DIAGNOSIS — D1801 Hemangioma of skin and subcutaneous tissue: Secondary | ICD-10-CM | POA: Diagnosis not present

## 2018-06-16 DIAGNOSIS — R6882 Decreased libido: Secondary | ICD-10-CM | POA: Diagnosis not present

## 2018-06-16 DIAGNOSIS — N951 Menopausal and female climacteric states: Secondary | ICD-10-CM | POA: Diagnosis not present

## 2018-06-16 DIAGNOSIS — M2550 Pain in unspecified joint: Secondary | ICD-10-CM | POA: Diagnosis not present

## 2018-06-16 DIAGNOSIS — G479 Sleep disorder, unspecified: Secondary | ICD-10-CM | POA: Diagnosis not present

## 2018-06-23 DIAGNOSIS — R5383 Other fatigue: Secondary | ICD-10-CM | POA: Diagnosis not present

## 2018-06-23 DIAGNOSIS — R6882 Decreased libido: Secondary | ICD-10-CM | POA: Diagnosis not present

## 2018-06-23 DIAGNOSIS — G479 Sleep disorder, unspecified: Secondary | ICD-10-CM | POA: Diagnosis not present

## 2018-06-23 DIAGNOSIS — N951 Menopausal and female climacteric states: Secondary | ICD-10-CM | POA: Diagnosis not present

## 2018-08-25 DIAGNOSIS — R7301 Impaired fasting glucose: Secondary | ICD-10-CM | POA: Diagnosis not present

## 2018-08-25 DIAGNOSIS — I10 Essential (primary) hypertension: Secondary | ICD-10-CM | POA: Diagnosis not present

## 2018-08-25 DIAGNOSIS — R300 Dysuria: Secondary | ICD-10-CM | POA: Diagnosis not present

## 2018-08-25 DIAGNOSIS — Z0000 Encounter for general adult medical examination without abnormal findings: Secondary | ICD-10-CM | POA: Diagnosis not present

## 2018-08-25 DIAGNOSIS — N390 Urinary tract infection, site not specified: Secondary | ICD-10-CM | POA: Diagnosis not present

## 2018-08-25 DIAGNOSIS — Z1331 Encounter for screening for depression: Secondary | ICD-10-CM | POA: Diagnosis not present

## 2018-08-25 DIAGNOSIS — Z1159 Encounter for screening for other viral diseases: Secondary | ICD-10-CM | POA: Diagnosis not present

## 2018-08-25 DIAGNOSIS — E785 Hyperlipidemia, unspecified: Secondary | ICD-10-CM | POA: Diagnosis not present

## 2018-08-25 DIAGNOSIS — Z79899 Other long term (current) drug therapy: Secondary | ICD-10-CM | POA: Diagnosis not present

## 2018-08-25 DIAGNOSIS — Z1339 Encounter for screening examination for other mental health and behavioral disorders: Secondary | ICD-10-CM | POA: Diagnosis not present

## 2018-08-25 DIAGNOSIS — Z6838 Body mass index (BMI) 38.0-38.9, adult: Secondary | ICD-10-CM | POA: Diagnosis not present

## 2018-10-11 DIAGNOSIS — G479 Sleep disorder, unspecified: Secondary | ICD-10-CM | POA: Diagnosis not present

## 2018-10-11 DIAGNOSIS — R5383 Other fatigue: Secondary | ICD-10-CM | POA: Diagnosis not present

## 2018-10-11 DIAGNOSIS — N951 Menopausal and female climacteric states: Secondary | ICD-10-CM | POA: Diagnosis not present

## 2018-10-11 DIAGNOSIS — R6882 Decreased libido: Secondary | ICD-10-CM | POA: Diagnosis not present

## 2018-10-20 DIAGNOSIS — N951 Menopausal and female climacteric states: Secondary | ICD-10-CM | POA: Diagnosis not present

## 2018-10-20 DIAGNOSIS — L680 Hirsutism: Secondary | ICD-10-CM | POA: Diagnosis not present

## 2018-10-20 DIAGNOSIS — R5383 Other fatigue: Secondary | ICD-10-CM | POA: Diagnosis not present

## 2018-10-20 DIAGNOSIS — G479 Sleep disorder, unspecified: Secondary | ICD-10-CM | POA: Diagnosis not present

## 2018-10-20 DIAGNOSIS — R6882 Decreased libido: Secondary | ICD-10-CM | POA: Diagnosis not present

## 2018-12-15 DIAGNOSIS — L570 Actinic keratosis: Secondary | ICD-10-CM | POA: Diagnosis not present

## 2018-12-15 DIAGNOSIS — L820 Inflamed seborrheic keratosis: Secondary | ICD-10-CM | POA: Diagnosis not present

## 2018-12-28 DIAGNOSIS — H00015 Hordeolum externum left lower eyelid: Secondary | ICD-10-CM | POA: Diagnosis not present

## 2019-02-01 DIAGNOSIS — Z23 Encounter for immunization: Secondary | ICD-10-CM | POA: Diagnosis not present

## 2019-02-16 DIAGNOSIS — N951 Menopausal and female climacteric states: Secondary | ICD-10-CM | POA: Diagnosis not present

## 2019-02-24 DIAGNOSIS — N951 Menopausal and female climacteric states: Secondary | ICD-10-CM | POA: Diagnosis not present

## 2019-02-24 DIAGNOSIS — G4700 Insomnia, unspecified: Secondary | ICD-10-CM | POA: Diagnosis not present

## 2019-02-24 DIAGNOSIS — I10 Essential (primary) hypertension: Secondary | ICD-10-CM | POA: Diagnosis not present

## 2019-02-24 DIAGNOSIS — Z79899 Other long term (current) drug therapy: Secondary | ICD-10-CM | POA: Diagnosis not present

## 2019-02-24 DIAGNOSIS — E049 Nontoxic goiter, unspecified: Secondary | ICD-10-CM | POA: Diagnosis not present

## 2019-02-24 DIAGNOSIS — E785 Hyperlipidemia, unspecified: Secondary | ICD-10-CM | POA: Diagnosis not present

## 2019-02-24 DIAGNOSIS — Z6838 Body mass index (BMI) 38.0-38.9, adult: Secondary | ICD-10-CM | POA: Diagnosis not present

## 2019-02-24 DIAGNOSIS — R6882 Decreased libido: Secondary | ICD-10-CM | POA: Diagnosis not present

## 2019-02-24 DIAGNOSIS — G479 Sleep disorder, unspecified: Secondary | ICD-10-CM | POA: Diagnosis not present

## 2019-02-24 DIAGNOSIS — Z1331 Encounter for screening for depression: Secondary | ICD-10-CM | POA: Diagnosis not present

## 2019-03-08 DIAGNOSIS — H2513 Age-related nuclear cataract, bilateral: Secondary | ICD-10-CM | POA: Diagnosis not present

## 2019-03-08 DIAGNOSIS — H5203 Hypermetropia, bilateral: Secondary | ICD-10-CM | POA: Diagnosis not present

## 2019-03-28 DIAGNOSIS — L814 Other melanin hyperpigmentation: Secondary | ICD-10-CM | POA: Diagnosis not present

## 2019-03-28 DIAGNOSIS — D225 Melanocytic nevi of trunk: Secondary | ICD-10-CM | POA: Diagnosis not present

## 2019-03-28 DIAGNOSIS — D1801 Hemangioma of skin and subcutaneous tissue: Secondary | ICD-10-CM | POA: Diagnosis not present

## 2019-03-28 DIAGNOSIS — D2239 Melanocytic nevi of other parts of face: Secondary | ICD-10-CM | POA: Diagnosis not present

## 2019-05-20 DIAGNOSIS — K52832 Lymphocytic colitis: Secondary | ICD-10-CM | POA: Diagnosis not present

## 2019-05-20 DIAGNOSIS — Z6838 Body mass index (BMI) 38.0-38.9, adult: Secondary | ICD-10-CM | POA: Diagnosis not present

## 2019-05-20 DIAGNOSIS — R05 Cough: Secondary | ICD-10-CM | POA: Diagnosis not present

## 2019-05-24 DIAGNOSIS — Z01818 Encounter for other preprocedural examination: Secondary | ICD-10-CM | POA: Diagnosis not present

## 2019-05-24 DIAGNOSIS — H2511 Age-related nuclear cataract, right eye: Secondary | ICD-10-CM | POA: Diagnosis not present

## 2019-05-26 DIAGNOSIS — Z1231 Encounter for screening mammogram for malignant neoplasm of breast: Secondary | ICD-10-CM | POA: Diagnosis not present

## 2019-05-31 DIAGNOSIS — E785 Hyperlipidemia, unspecified: Secondary | ICD-10-CM | POA: Diagnosis not present

## 2019-05-31 DIAGNOSIS — H259 Unspecified age-related cataract: Secondary | ICD-10-CM | POA: Diagnosis not present

## 2019-05-31 DIAGNOSIS — H2511 Age-related nuclear cataract, right eye: Secondary | ICD-10-CM | POA: Diagnosis not present

## 2019-05-31 DIAGNOSIS — E1136 Type 2 diabetes mellitus with diabetic cataract: Secondary | ICD-10-CM | POA: Diagnosis not present

## 2019-05-31 DIAGNOSIS — Z79899 Other long term (current) drug therapy: Secondary | ICD-10-CM | POA: Diagnosis not present

## 2019-05-31 DIAGNOSIS — H40003 Preglaucoma, unspecified, bilateral: Secondary | ICD-10-CM | POA: Diagnosis not present

## 2019-05-31 DIAGNOSIS — I10 Essential (primary) hypertension: Secondary | ICD-10-CM | POA: Diagnosis not present

## 2019-05-31 DIAGNOSIS — H02889 Meibomian gland dysfunction of unspecified eye, unspecified eyelid: Secondary | ICD-10-CM | POA: Diagnosis not present

## 2019-05-31 DIAGNOSIS — E669 Obesity, unspecified: Secondary | ICD-10-CM | POA: Diagnosis not present

## 2019-05-31 DIAGNOSIS — Z961 Presence of intraocular lens: Secondary | ICD-10-CM | POA: Diagnosis not present

## 2019-05-31 DIAGNOSIS — Z7982 Long term (current) use of aspirin: Secondary | ICD-10-CM | POA: Diagnosis not present

## 2019-07-01 DIAGNOSIS — E785 Hyperlipidemia, unspecified: Secondary | ICD-10-CM | POA: Diagnosis not present

## 2019-07-01 DIAGNOSIS — I10 Essential (primary) hypertension: Secondary | ICD-10-CM | POA: Diagnosis not present

## 2019-07-01 DIAGNOSIS — R197 Diarrhea, unspecified: Secondary | ICD-10-CM | POA: Diagnosis not present

## 2019-07-01 DIAGNOSIS — Z6838 Body mass index (BMI) 38.0-38.9, adult: Secondary | ICD-10-CM | POA: Diagnosis not present

## 2019-07-01 DIAGNOSIS — L989 Disorder of the skin and subcutaneous tissue, unspecified: Secondary | ICD-10-CM | POA: Diagnosis not present

## 2019-07-01 DIAGNOSIS — R05 Cough: Secondary | ICD-10-CM | POA: Diagnosis not present

## 2019-07-01 DIAGNOSIS — Z1331 Encounter for screening for depression: Secondary | ICD-10-CM | POA: Diagnosis not present

## 2019-07-12 DIAGNOSIS — M1990 Unspecified osteoarthritis, unspecified site: Secondary | ICD-10-CM | POA: Diagnosis not present

## 2019-07-12 DIAGNOSIS — J40 Bronchitis, not specified as acute or chronic: Secondary | ICD-10-CM | POA: Diagnosis not present

## 2019-07-12 DIAGNOSIS — E785 Hyperlipidemia, unspecified: Secondary | ICD-10-CM | POA: Diagnosis not present

## 2019-07-12 DIAGNOSIS — I10 Essential (primary) hypertension: Secondary | ICD-10-CM | POA: Diagnosis not present

## 2019-07-12 DIAGNOSIS — Z6837 Body mass index (BMI) 37.0-37.9, adult: Secondary | ICD-10-CM | POA: Diagnosis not present

## 2019-07-12 DIAGNOSIS — Z79899 Other long term (current) drug therapy: Secondary | ICD-10-CM | POA: Diagnosis not present

## 2019-07-12 DIAGNOSIS — H2512 Age-related nuclear cataract, left eye: Secondary | ICD-10-CM | POA: Diagnosis not present

## 2019-07-12 DIAGNOSIS — E1136 Type 2 diabetes mellitus with diabetic cataract: Secondary | ICD-10-CM | POA: Diagnosis not present

## 2019-07-12 DIAGNOSIS — E669 Obesity, unspecified: Secondary | ICD-10-CM | POA: Diagnosis not present

## 2019-07-12 DIAGNOSIS — Z7982 Long term (current) use of aspirin: Secondary | ICD-10-CM | POA: Diagnosis not present

## 2019-07-12 DIAGNOSIS — G4700 Insomnia, unspecified: Secondary | ICD-10-CM | POA: Diagnosis not present

## 2019-07-12 DIAGNOSIS — H40003 Preglaucoma, unspecified, bilateral: Secondary | ICD-10-CM | POA: Diagnosis not present

## 2019-07-12 DIAGNOSIS — H259 Unspecified age-related cataract: Secondary | ICD-10-CM | POA: Diagnosis not present

## 2019-07-12 DIAGNOSIS — H02889 Meibomian gland dysfunction of unspecified eye, unspecified eyelid: Secondary | ICD-10-CM | POA: Diagnosis not present

## 2019-10-06 DIAGNOSIS — I10 Essential (primary) hypertension: Secondary | ICD-10-CM | POA: Diagnosis not present

## 2019-10-06 DIAGNOSIS — E785 Hyperlipidemia, unspecified: Secondary | ICD-10-CM | POA: Diagnosis not present

## 2019-10-06 DIAGNOSIS — Z1339 Encounter for screening examination for other mental health and behavioral disorders: Secondary | ICD-10-CM | POA: Diagnosis not present

## 2019-10-06 DIAGNOSIS — L719 Rosacea, unspecified: Secondary | ICD-10-CM | POA: Diagnosis not present

## 2019-10-06 DIAGNOSIS — Z6838 Body mass index (BMI) 38.0-38.9, adult: Secondary | ICD-10-CM | POA: Diagnosis not present

## 2019-10-06 DIAGNOSIS — Z79899 Other long term (current) drug therapy: Secondary | ICD-10-CM | POA: Diagnosis not present

## 2019-10-06 DIAGNOSIS — E049 Nontoxic goiter, unspecified: Secondary | ICD-10-CM | POA: Diagnosis not present

## 2019-10-06 DIAGNOSIS — Z0000 Encounter for general adult medical examination without abnormal findings: Secondary | ICD-10-CM | POA: Diagnosis not present

## 2019-10-06 DIAGNOSIS — Z1331 Encounter for screening for depression: Secondary | ICD-10-CM | POA: Diagnosis not present

## 2020-01-19 DIAGNOSIS — L719 Rosacea, unspecified: Secondary | ICD-10-CM | POA: Diagnosis not present

## 2020-02-16 DIAGNOSIS — E785 Hyperlipidemia, unspecified: Secondary | ICD-10-CM | POA: Diagnosis not present

## 2020-02-23 DIAGNOSIS — Z23 Encounter for immunization: Secondary | ICD-10-CM | POA: Diagnosis not present

## 2020-03-01 DIAGNOSIS — N951 Menopausal and female climacteric states: Secondary | ICD-10-CM | POA: Diagnosis not present

## 2020-03-01 DIAGNOSIS — G479 Sleep disorder, unspecified: Secondary | ICD-10-CM | POA: Diagnosis not present

## 2020-03-01 DIAGNOSIS — R6882 Decreased libido: Secondary | ICD-10-CM | POA: Diagnosis not present

## 2020-03-14 DIAGNOSIS — Z6839 Body mass index (BMI) 39.0-39.9, adult: Secondary | ICD-10-CM | POA: Diagnosis not present

## 2020-03-14 DIAGNOSIS — R6882 Decreased libido: Secondary | ICD-10-CM | POA: Diagnosis not present

## 2020-03-14 DIAGNOSIS — N951 Menopausal and female climacteric states: Secondary | ICD-10-CM | POA: Diagnosis not present

## 2020-03-19 DIAGNOSIS — H43812 Vitreous degeneration, left eye: Secondary | ICD-10-CM | POA: Diagnosis not present

## 2020-03-19 DIAGNOSIS — H524 Presbyopia: Secondary | ICD-10-CM | POA: Diagnosis not present

## 2020-03-19 DIAGNOSIS — H26493 Other secondary cataract, bilateral: Secondary | ICD-10-CM | POA: Diagnosis not present

## 2020-03-19 DIAGNOSIS — H43393 Other vitreous opacities, bilateral: Secondary | ICD-10-CM | POA: Diagnosis not present

## 2020-03-19 DIAGNOSIS — Z961 Presence of intraocular lens: Secondary | ICD-10-CM | POA: Diagnosis not present

## 2020-04-02 DIAGNOSIS — D2239 Melanocytic nevi of other parts of face: Secondary | ICD-10-CM | POA: Diagnosis not present

## 2020-04-02 DIAGNOSIS — L570 Actinic keratosis: Secondary | ICD-10-CM | POA: Diagnosis not present

## 2020-04-02 DIAGNOSIS — L820 Inflamed seborrheic keratosis: Secondary | ICD-10-CM | POA: Diagnosis not present

## 2020-04-02 DIAGNOSIS — L814 Other melanin hyperpigmentation: Secondary | ICD-10-CM | POA: Diagnosis not present

## 2020-04-02 DIAGNOSIS — D225 Melanocytic nevi of trunk: Secondary | ICD-10-CM | POA: Diagnosis not present

## 2020-04-16 DIAGNOSIS — Z23 Encounter for immunization: Secondary | ICD-10-CM | POA: Diagnosis not present

## 2020-06-14 DIAGNOSIS — Z1231 Encounter for screening mammogram for malignant neoplasm of breast: Secondary | ICD-10-CM | POA: Diagnosis not present

## 2020-07-04 DIAGNOSIS — E039 Hypothyroidism, unspecified: Secondary | ICD-10-CM | POA: Diagnosis not present

## 2020-07-04 DIAGNOSIS — N390 Urinary tract infection, site not specified: Secondary | ICD-10-CM | POA: Diagnosis not present

## 2020-07-04 DIAGNOSIS — N951 Menopausal and female climacteric states: Secondary | ICD-10-CM | POA: Diagnosis not present

## 2020-07-11 DIAGNOSIS — E039 Hypothyroidism, unspecified: Secondary | ICD-10-CM | POA: Diagnosis not present

## 2020-07-11 DIAGNOSIS — N951 Menopausal and female climacteric states: Secondary | ICD-10-CM | POA: Diagnosis not present

## 2020-07-11 DIAGNOSIS — M2550 Pain in unspecified joint: Secondary | ICD-10-CM | POA: Diagnosis not present

## 2020-07-11 DIAGNOSIS — Z6839 Body mass index (BMI) 39.0-39.9, adult: Secondary | ICD-10-CM | POA: Diagnosis not present

## 2020-07-11 DIAGNOSIS — R5383 Other fatigue: Secondary | ICD-10-CM | POA: Diagnosis not present

## 2020-10-09 DIAGNOSIS — Z1339 Encounter for screening examination for other mental health and behavioral disorders: Secondary | ICD-10-CM | POA: Diagnosis not present

## 2020-10-09 DIAGNOSIS — Z0000 Encounter for general adult medical examination without abnormal findings: Secondary | ICD-10-CM | POA: Diagnosis not present

## 2020-10-09 DIAGNOSIS — Z79899 Other long term (current) drug therapy: Secondary | ICD-10-CM | POA: Diagnosis not present

## 2020-10-09 DIAGNOSIS — R21 Rash and other nonspecific skin eruption: Secondary | ICD-10-CM | POA: Diagnosis not present

## 2020-10-09 DIAGNOSIS — Z6839 Body mass index (BMI) 39.0-39.9, adult: Secondary | ICD-10-CM | POA: Diagnosis not present

## 2020-10-09 DIAGNOSIS — E785 Hyperlipidemia, unspecified: Secondary | ICD-10-CM | POA: Diagnosis not present

## 2020-10-09 DIAGNOSIS — K219 Gastro-esophageal reflux disease without esophagitis: Secondary | ICD-10-CM | POA: Diagnosis not present

## 2020-10-09 DIAGNOSIS — I10 Essential (primary) hypertension: Secondary | ICD-10-CM | POA: Diagnosis not present

## 2020-10-09 DIAGNOSIS — Z1331 Encounter for screening for depression: Secondary | ICD-10-CM | POA: Diagnosis not present

## 2020-12-05 DIAGNOSIS — N951 Menopausal and female climacteric states: Secondary | ICD-10-CM | POA: Diagnosis not present

## 2020-12-05 DIAGNOSIS — E039 Hypothyroidism, unspecified: Secondary | ICD-10-CM | POA: Diagnosis not present

## 2020-12-11 DIAGNOSIS — L680 Hirsutism: Secondary | ICD-10-CM | POA: Diagnosis not present

## 2020-12-11 DIAGNOSIS — R5383 Other fatigue: Secondary | ICD-10-CM | POA: Diagnosis not present

## 2020-12-11 DIAGNOSIS — M2550 Pain in unspecified joint: Secondary | ICD-10-CM | POA: Diagnosis not present

## 2020-12-11 DIAGNOSIS — G479 Sleep disorder, unspecified: Secondary | ICD-10-CM | POA: Diagnosis not present

## 2020-12-11 DIAGNOSIS — N951 Menopausal and female climacteric states: Secondary | ICD-10-CM | POA: Diagnosis not present

## 2020-12-11 DIAGNOSIS — R6882 Decreased libido: Secondary | ICD-10-CM | POA: Diagnosis not present

## 2021-01-09 DIAGNOSIS — D582 Other hemoglobinopathies: Secondary | ICD-10-CM | POA: Diagnosis not present

## 2021-02-25 DIAGNOSIS — Z23 Encounter for immunization: Secondary | ICD-10-CM | POA: Diagnosis not present

## 2021-03-20 DIAGNOSIS — Z961 Presence of intraocular lens: Secondary | ICD-10-CM | POA: Diagnosis not present

## 2021-03-20 DIAGNOSIS — H40003 Preglaucoma, unspecified, bilateral: Secondary | ICD-10-CM | POA: Diagnosis not present

## 2021-03-20 DIAGNOSIS — H524 Presbyopia: Secondary | ICD-10-CM | POA: Diagnosis not present

## 2021-03-20 DIAGNOSIS — H26493 Other secondary cataract, bilateral: Secondary | ICD-10-CM | POA: Diagnosis not present

## 2021-03-20 DIAGNOSIS — H43812 Vitreous degeneration, left eye: Secondary | ICD-10-CM | POA: Diagnosis not present

## 2021-03-20 DIAGNOSIS — H43393 Other vitreous opacities, bilateral: Secondary | ICD-10-CM | POA: Diagnosis not present

## 2021-04-04 DIAGNOSIS — L570 Actinic keratosis: Secondary | ICD-10-CM | POA: Diagnosis not present

## 2021-04-04 DIAGNOSIS — L814 Other melanin hyperpigmentation: Secondary | ICD-10-CM | POA: Diagnosis not present

## 2021-04-04 DIAGNOSIS — L821 Other seborrheic keratosis: Secondary | ICD-10-CM | POA: Diagnosis not present

## 2021-04-04 DIAGNOSIS — D2239 Melanocytic nevi of other parts of face: Secondary | ICD-10-CM | POA: Diagnosis not present

## 2021-04-04 DIAGNOSIS — D225 Melanocytic nevi of trunk: Secondary | ICD-10-CM | POA: Diagnosis not present

## 2021-05-28 DIAGNOSIS — R7989 Other specified abnormal findings of blood chemistry: Secondary | ICD-10-CM | POA: Diagnosis not present

## 2021-05-28 DIAGNOSIS — I10 Essential (primary) hypertension: Secondary | ICD-10-CM | POA: Diagnosis not present

## 2021-05-28 DIAGNOSIS — Z79899 Other long term (current) drug therapy: Secondary | ICD-10-CM | POA: Diagnosis not present

## 2021-05-28 DIAGNOSIS — G4700 Insomnia, unspecified: Secondary | ICD-10-CM | POA: Diagnosis not present

## 2021-05-28 DIAGNOSIS — Z1231 Encounter for screening mammogram for malignant neoplasm of breast: Secondary | ICD-10-CM | POA: Diagnosis not present

## 2021-05-28 DIAGNOSIS — E785 Hyperlipidemia, unspecified: Secondary | ICD-10-CM | POA: Diagnosis not present

## 2021-05-28 DIAGNOSIS — Z6835 Body mass index (BMI) 35.0-35.9, adult: Secondary | ICD-10-CM | POA: Diagnosis not present

## 2021-05-28 DIAGNOSIS — L659 Nonscarring hair loss, unspecified: Secondary | ICD-10-CM | POA: Diagnosis not present

## 2021-07-02 DIAGNOSIS — Z1231 Encounter for screening mammogram for malignant neoplasm of breast: Secondary | ICD-10-CM | POA: Diagnosis not present

## 2021-10-04 DIAGNOSIS — J069 Acute upper respiratory infection, unspecified: Secondary | ICD-10-CM | POA: Diagnosis not present

## 2021-10-04 DIAGNOSIS — R062 Wheezing: Secondary | ICD-10-CM | POA: Diagnosis not present

## 2021-10-04 DIAGNOSIS — J209 Acute bronchitis, unspecified: Secondary | ICD-10-CM | POA: Diagnosis not present

## 2021-10-04 DIAGNOSIS — R051 Acute cough: Secondary | ICD-10-CM | POA: Diagnosis not present

## 2021-10-10 DIAGNOSIS — E785 Hyperlipidemia, unspecified: Secondary | ICD-10-CM | POA: Diagnosis not present

## 2021-10-10 DIAGNOSIS — Z6836 Body mass index (BMI) 36.0-36.9, adult: Secondary | ICD-10-CM | POA: Diagnosis not present

## 2021-10-10 DIAGNOSIS — Z79899 Other long term (current) drug therapy: Secondary | ICD-10-CM | POA: Diagnosis not present

## 2021-10-10 DIAGNOSIS — I10 Essential (primary) hypertension: Secondary | ICD-10-CM | POA: Diagnosis not present

## 2021-10-10 DIAGNOSIS — Z1331 Encounter for screening for depression: Secondary | ICD-10-CM | POA: Diagnosis not present

## 2021-10-10 DIAGNOSIS — R062 Wheezing: Secondary | ICD-10-CM | POA: Diagnosis not present

## 2021-10-10 DIAGNOSIS — R32 Unspecified urinary incontinence: Secondary | ICD-10-CM | POA: Diagnosis not present

## 2021-10-10 DIAGNOSIS — Z0000 Encounter for general adult medical examination without abnormal findings: Secondary | ICD-10-CM | POA: Diagnosis not present

## 2021-10-10 DIAGNOSIS — E049 Nontoxic goiter, unspecified: Secondary | ICD-10-CM | POA: Diagnosis not present

## 2021-10-10 DIAGNOSIS — N951 Menopausal and female climacteric states: Secondary | ICD-10-CM | POA: Diagnosis not present

## 2021-10-10 DIAGNOSIS — F5101 Primary insomnia: Secondary | ICD-10-CM | POA: Diagnosis not present

## 2022-02-25 DIAGNOSIS — Z23 Encounter for immunization: Secondary | ICD-10-CM | POA: Diagnosis not present

## 2022-03-25 DIAGNOSIS — I10 Essential (primary) hypertension: Secondary | ICD-10-CM | POA: Diagnosis not present

## 2022-03-25 DIAGNOSIS — E785 Hyperlipidemia, unspecified: Secondary | ICD-10-CM | POA: Diagnosis not present

## 2022-03-25 DIAGNOSIS — F5101 Primary insomnia: Secondary | ICD-10-CM | POA: Diagnosis not present

## 2022-03-25 DIAGNOSIS — R430 Anosmia: Secondary | ICD-10-CM | POA: Diagnosis not present

## 2022-03-25 DIAGNOSIS — E669 Obesity, unspecified: Secondary | ICD-10-CM | POA: Diagnosis not present

## 2022-03-25 DIAGNOSIS — K219 Gastro-esophageal reflux disease without esophagitis: Secondary | ICD-10-CM | POA: Diagnosis not present

## 2022-03-25 DIAGNOSIS — Z79899 Other long term (current) drug therapy: Secondary | ICD-10-CM | POA: Diagnosis not present

## 2022-03-25 DIAGNOSIS — E049 Nontoxic goiter, unspecified: Secondary | ICD-10-CM | POA: Diagnosis not present

## 2022-04-10 DIAGNOSIS — H524 Presbyopia: Secondary | ICD-10-CM | POA: Diagnosis not present

## 2022-04-10 DIAGNOSIS — H43812 Vitreous degeneration, left eye: Secondary | ICD-10-CM | POA: Diagnosis not present

## 2022-04-10 DIAGNOSIS — Z961 Presence of intraocular lens: Secondary | ICD-10-CM | POA: Diagnosis not present

## 2022-04-10 DIAGNOSIS — H26493 Other secondary cataract, bilateral: Secondary | ICD-10-CM | POA: Diagnosis not present

## 2022-04-10 DIAGNOSIS — H43393 Other vitreous opacities, bilateral: Secondary | ICD-10-CM | POA: Diagnosis not present

## 2022-04-10 DIAGNOSIS — H40003 Preglaucoma, unspecified, bilateral: Secondary | ICD-10-CM | POA: Diagnosis not present

## 2022-04-17 DIAGNOSIS — D2239 Melanocytic nevi of other parts of face: Secondary | ICD-10-CM | POA: Diagnosis not present

## 2022-04-17 DIAGNOSIS — L821 Other seborrheic keratosis: Secondary | ICD-10-CM | POA: Diagnosis not present

## 2022-04-17 DIAGNOSIS — L578 Other skin changes due to chronic exposure to nonionizing radiation: Secondary | ICD-10-CM | POA: Diagnosis not present

## 2022-04-17 DIAGNOSIS — L570 Actinic keratosis: Secondary | ICD-10-CM | POA: Diagnosis not present

## 2022-04-17 DIAGNOSIS — D225 Melanocytic nevi of trunk: Secondary | ICD-10-CM | POA: Diagnosis not present

## 2022-04-17 DIAGNOSIS — D485 Neoplasm of uncertain behavior of skin: Secondary | ICD-10-CM | POA: Diagnosis not present

## 2022-04-26 DIAGNOSIS — R509 Fever, unspecified: Secondary | ICD-10-CM | POA: Diagnosis not present

## 2022-04-26 DIAGNOSIS — J101 Influenza due to other identified influenza virus with other respiratory manifestations: Secondary | ICD-10-CM | POA: Diagnosis not present

## 2022-04-29 DIAGNOSIS — R059 Cough, unspecified: Secondary | ICD-10-CM | POA: Diagnosis not present

## 2022-07-07 DIAGNOSIS — Z1231 Encounter for screening mammogram for malignant neoplasm of breast: Secondary | ICD-10-CM | POA: Diagnosis not present

## 2022-07-15 DIAGNOSIS — Z79899 Other long term (current) drug therapy: Secondary | ICD-10-CM | POA: Diagnosis not present

## 2022-07-15 DIAGNOSIS — E785 Hyperlipidemia, unspecified: Secondary | ICD-10-CM | POA: Diagnosis not present

## 2022-08-05 DIAGNOSIS — R200 Anesthesia of skin: Secondary | ICD-10-CM | POA: Diagnosis not present

## 2022-08-05 DIAGNOSIS — Z79899 Other long term (current) drug therapy: Secondary | ICD-10-CM | POA: Diagnosis not present

## 2022-08-05 DIAGNOSIS — Z6838 Body mass index (BMI) 38.0-38.9, adult: Secondary | ICD-10-CM | POA: Diagnosis not present

## 2022-08-05 DIAGNOSIS — I10 Essential (primary) hypertension: Secondary | ICD-10-CM | POA: Diagnosis not present

## 2022-08-05 DIAGNOSIS — E785 Hyperlipidemia, unspecified: Secondary | ICD-10-CM | POA: Diagnosis not present

## 2022-08-12 DIAGNOSIS — I6389 Other cerebral infarction: Secondary | ICD-10-CM | POA: Diagnosis not present

## 2022-08-12 DIAGNOSIS — I6523 Occlusion and stenosis of bilateral carotid arteries: Secondary | ICD-10-CM | POA: Diagnosis not present

## 2022-08-12 DIAGNOSIS — G459 Transient cerebral ischemic attack, unspecified: Secondary | ICD-10-CM | POA: Diagnosis not present

## 2022-08-14 DIAGNOSIS — G459 Transient cerebral ischemic attack, unspecified: Secondary | ICD-10-CM | POA: Diagnosis not present

## 2022-08-14 DIAGNOSIS — R200 Anesthesia of skin: Secondary | ICD-10-CM | POA: Diagnosis not present

## 2022-10-21 DIAGNOSIS — Z6836 Body mass index (BMI) 36.0-36.9, adult: Secondary | ICD-10-CM | POA: Diagnosis not present

## 2022-10-21 DIAGNOSIS — I679 Cerebrovascular disease, unspecified: Secondary | ICD-10-CM | POA: Diagnosis not present

## 2022-10-21 DIAGNOSIS — Z0000 Encounter for general adult medical examination without abnormal findings: Secondary | ICD-10-CM | POA: Diagnosis not present

## 2022-10-21 DIAGNOSIS — Z1331 Encounter for screening for depression: Secondary | ICD-10-CM | POA: Diagnosis not present

## 2022-10-21 DIAGNOSIS — E785 Hyperlipidemia, unspecified: Secondary | ICD-10-CM | POA: Diagnosis not present

## 2022-10-21 DIAGNOSIS — I10 Essential (primary) hypertension: Secondary | ICD-10-CM | POA: Diagnosis not present

## 2023-02-24 DIAGNOSIS — Z23 Encounter for immunization: Secondary | ICD-10-CM | POA: Diagnosis not present

## 2023-03-24 DIAGNOSIS — E785 Hyperlipidemia, unspecified: Secondary | ICD-10-CM | POA: Diagnosis not present

## 2023-03-24 DIAGNOSIS — Z79899 Other long term (current) drug therapy: Secondary | ICD-10-CM | POA: Diagnosis not present

## 2023-03-24 DIAGNOSIS — R42 Dizziness and giddiness: Secondary | ICD-10-CM | POA: Diagnosis not present

## 2023-03-24 DIAGNOSIS — H6120 Impacted cerumen, unspecified ear: Secondary | ICD-10-CM | POA: Diagnosis not present

## 2023-03-24 DIAGNOSIS — Z6837 Body mass index (BMI) 37.0-37.9, adult: Secondary | ICD-10-CM | POA: Diagnosis not present

## 2023-03-24 DIAGNOSIS — I10 Essential (primary) hypertension: Secondary | ICD-10-CM | POA: Diagnosis not present

## 2023-03-24 DIAGNOSIS — E049 Nontoxic goiter, unspecified: Secondary | ICD-10-CM | POA: Diagnosis not present

## 2023-04-14 DIAGNOSIS — Z6838 Body mass index (BMI) 38.0-38.9, adult: Secondary | ICD-10-CM | POA: Diagnosis not present

## 2023-04-14 DIAGNOSIS — H6120 Impacted cerumen, unspecified ear: Secondary | ICD-10-CM | POA: Diagnosis not present

## 2023-07-01 DIAGNOSIS — L719 Rosacea, unspecified: Secondary | ICD-10-CM | POA: Diagnosis not present

## 2023-07-01 DIAGNOSIS — D2239 Melanocytic nevi of other parts of face: Secondary | ICD-10-CM | POA: Diagnosis not present

## 2023-07-01 DIAGNOSIS — L820 Inflamed seborrheic keratosis: Secondary | ICD-10-CM | POA: Diagnosis not present

## 2023-07-01 DIAGNOSIS — D225 Melanocytic nevi of trunk: Secondary | ICD-10-CM | POA: Diagnosis not present

## 2023-07-01 DIAGNOSIS — L814 Other melanin hyperpigmentation: Secondary | ICD-10-CM | POA: Diagnosis not present

## 2023-07-21 DIAGNOSIS — Z1231 Encounter for screening mammogram for malignant neoplasm of breast: Secondary | ICD-10-CM | POA: Diagnosis not present

## 2023-07-27 DIAGNOSIS — R5081 Fever presenting with conditions classified elsewhere: Secondary | ICD-10-CM | POA: Diagnosis not present

## 2023-07-27 DIAGNOSIS — R0981 Nasal congestion: Secondary | ICD-10-CM | POA: Diagnosis not present

## 2023-07-27 DIAGNOSIS — R051 Acute cough: Secondary | ICD-10-CM | POA: Diagnosis not present

## 2023-07-27 DIAGNOSIS — J101 Influenza due to other identified influenza virus with other respiratory manifestations: Secondary | ICD-10-CM | POA: Diagnosis not present

## 2023-09-23 DIAGNOSIS — L03211 Cellulitis of face: Secondary | ICD-10-CM | POA: Diagnosis not present

## 2023-09-23 DIAGNOSIS — H01002 Unspecified blepharitis right lower eyelid: Secondary | ICD-10-CM | POA: Diagnosis not present

## 2023-09-23 DIAGNOSIS — H1033 Unspecified acute conjunctivitis, bilateral: Secondary | ICD-10-CM | POA: Diagnosis not present

## 2023-09-23 DIAGNOSIS — H01005 Unspecified blepharitis left lower eyelid: Secondary | ICD-10-CM | POA: Diagnosis not present

## 2023-10-02 DIAGNOSIS — B308 Other viral conjunctivitis: Secondary | ICD-10-CM | POA: Diagnosis not present

## 2023-10-02 DIAGNOSIS — H43812 Vitreous degeneration, left eye: Secondary | ICD-10-CM | POA: Diagnosis not present

## 2023-10-02 DIAGNOSIS — H40003 Preglaucoma, unspecified, bilateral: Secondary | ICD-10-CM | POA: Diagnosis not present

## 2023-10-02 DIAGNOSIS — Z961 Presence of intraocular lens: Secondary | ICD-10-CM | POA: Diagnosis not present

## 2023-10-02 DIAGNOSIS — H524 Presbyopia: Secondary | ICD-10-CM | POA: Diagnosis not present

## 2023-10-02 DIAGNOSIS — H43393 Other vitreous opacities, bilateral: Secondary | ICD-10-CM | POA: Diagnosis not present

## 2023-10-02 DIAGNOSIS — H26493 Other secondary cataract, bilateral: Secondary | ICD-10-CM | POA: Diagnosis not present

## 2023-10-05 DIAGNOSIS — Z961 Presence of intraocular lens: Secondary | ICD-10-CM | POA: Diagnosis not present

## 2023-10-05 DIAGNOSIS — H40013 Open angle with borderline findings, low risk, bilateral: Secondary | ICD-10-CM | POA: Diagnosis not present

## 2023-10-05 DIAGNOSIS — H524 Presbyopia: Secondary | ICD-10-CM | POA: Diagnosis not present

## 2023-10-05 DIAGNOSIS — B308 Other viral conjunctivitis: Secondary | ICD-10-CM | POA: Diagnosis not present

## 2023-10-05 DIAGNOSIS — H43812 Vitreous degeneration, left eye: Secondary | ICD-10-CM | POA: Diagnosis not present

## 2023-10-05 DIAGNOSIS — H43393 Other vitreous opacities, bilateral: Secondary | ICD-10-CM | POA: Diagnosis not present

## 2023-10-05 DIAGNOSIS — H26493 Other secondary cataract, bilateral: Secondary | ICD-10-CM | POA: Diagnosis not present

## 2023-10-20 DIAGNOSIS — D485 Neoplasm of uncertain behavior of skin: Secondary | ICD-10-CM | POA: Diagnosis not present

## 2023-10-20 DIAGNOSIS — L719 Rosacea, unspecified: Secondary | ICD-10-CM | POA: Diagnosis not present

## 2023-10-20 DIAGNOSIS — E785 Hyperlipidemia, unspecified: Secondary | ICD-10-CM | POA: Diagnosis not present

## 2023-10-20 DIAGNOSIS — R7309 Other abnormal glucose: Secondary | ICD-10-CM | POA: Diagnosis not present

## 2023-10-20 DIAGNOSIS — I10 Essential (primary) hypertension: Secondary | ICD-10-CM | POA: Diagnosis not present

## 2023-10-20 DIAGNOSIS — Z79899 Other long term (current) drug therapy: Secondary | ICD-10-CM | POA: Diagnosis not present

## 2023-10-20 DIAGNOSIS — E049 Nontoxic goiter, unspecified: Secondary | ICD-10-CM | POA: Diagnosis not present

## 2023-10-22 DIAGNOSIS — I10 Essential (primary) hypertension: Secondary | ICD-10-CM | POA: Diagnosis not present

## 2023-10-22 DIAGNOSIS — E2839 Other primary ovarian failure: Secondary | ICD-10-CM | POA: Diagnosis not present

## 2023-10-22 DIAGNOSIS — E785 Hyperlipidemia, unspecified: Secondary | ICD-10-CM | POA: Diagnosis not present

## 2023-10-22 DIAGNOSIS — Z6839 Body mass index (BMI) 39.0-39.9, adult: Secondary | ICD-10-CM | POA: Diagnosis not present

## 2023-10-22 DIAGNOSIS — Z0000 Encounter for general adult medical examination without abnormal findings: Secondary | ICD-10-CM | POA: Diagnosis not present

## 2023-10-22 DIAGNOSIS — Z1331 Encounter for screening for depression: Secondary | ICD-10-CM | POA: Diagnosis not present

## 2023-10-22 DIAGNOSIS — F5101 Primary insomnia: Secondary | ICD-10-CM | POA: Diagnosis not present

## 2023-11-03 DIAGNOSIS — L209 Atopic dermatitis, unspecified: Secondary | ICD-10-CM | POA: Diagnosis not present

## 2023-12-01 DIAGNOSIS — L719 Rosacea, unspecified: Secondary | ICD-10-CM | POA: Diagnosis not present

## 2023-12-01 DIAGNOSIS — D485 Neoplasm of uncertain behavior of skin: Secondary | ICD-10-CM | POA: Diagnosis not present

## 2023-12-16 DIAGNOSIS — E2839 Other primary ovarian failure: Secondary | ICD-10-CM | POA: Diagnosis not present

## 2023-12-21 DIAGNOSIS — E2839 Other primary ovarian failure: Secondary | ICD-10-CM | POA: Diagnosis not present

## 2024-02-11 DIAGNOSIS — Z23 Encounter for immunization: Secondary | ICD-10-CM | POA: Diagnosis not present

## 2024-03-02 DIAGNOSIS — L719 Rosacea, unspecified: Secondary | ICD-10-CM | POA: Diagnosis not present
